# Patient Record
Sex: Female | Born: 1950 | Race: White | Hispanic: No | Smoking: Former smoker
Health system: Southern US, Community
[De-identification: ages and names within clinical notes are randomized; demographics above are authoritative.]

## PROBLEM LIST (undated history)

## (undated) DIAGNOSIS — I471 Supraventricular tachycardia, unspecified: Secondary | ICD-10-CM

## (undated) DIAGNOSIS — Z720 Tobacco use: Secondary | ICD-10-CM

## (undated) DIAGNOSIS — J449 Chronic obstructive pulmonary disease, unspecified: Secondary | ICD-10-CM

## (undated) HISTORY — DX: Supraventricular tachycardia: I47.1

## (undated) HISTORY — DX: Chronic obstructive pulmonary disease, unspecified: J44.9

## (undated) HISTORY — DX: Supraventricular tachycardia, unspecified: I47.10

## (undated) HISTORY — PX: TUBAL LIGATION: SHX77

## (undated) HISTORY — DX: Tobacco use: Z72.0

## (undated) HISTORY — PX: BREAST BIOPSY: SHX20

---

## 2009-05-17 ENCOUNTER — Ambulatory Visit: Payer: Self-pay | Admitting: Cardiology

## 2011-05-28 DIAGNOSIS — I471 Supraventricular tachycardia: Secondary | ICD-10-CM

## 2011-05-29 DIAGNOSIS — I471 Supraventricular tachycardia: Secondary | ICD-10-CM

## 2011-05-31 DIAGNOSIS — I4892 Unspecified atrial flutter: Secondary | ICD-10-CM

## 2011-06-01 DIAGNOSIS — J449 Chronic obstructive pulmonary disease, unspecified: Secondary | ICD-10-CM

## 2011-06-02 ENCOUNTER — Other Ambulatory Visit: Payer: Self-pay

## 2011-06-13 ENCOUNTER — Encounter: Payer: Self-pay | Admitting: *Deleted

## 2011-06-20 ENCOUNTER — Ambulatory Visit (INDEPENDENT_AMBULATORY_CARE_PROVIDER_SITE_OTHER): Payer: Medicare Other | Admitting: Cardiovascular Disease

## 2011-06-20 ENCOUNTER — Encounter: Payer: Self-pay | Admitting: Cardiovascular Disease

## 2011-06-20 VITALS — BP 112/78 | HR 103 | Ht 68.0 in | Wt 192.0 lb

## 2011-06-20 DIAGNOSIS — Z720 Tobacco use: Secondary | ICD-10-CM | POA: Insufficient documentation

## 2011-06-20 DIAGNOSIS — R000 Tachycardia, unspecified: Secondary | ICD-10-CM

## 2011-06-20 DIAGNOSIS — I498 Other specified cardiac arrhythmias: Secondary | ICD-10-CM

## 2011-06-20 DIAGNOSIS — I471 Supraventricular tachycardia: Secondary | ICD-10-CM

## 2011-06-20 DIAGNOSIS — F17200 Nicotine dependence, unspecified, uncomplicated: Secondary | ICD-10-CM

## 2011-06-20 MED ORDER — DILTIAZEM HCL ER COATED BEADS 240 MG PO CP24: 240.0000 mg | ORAL_CAPSULE | Freq: Every day | ORAL | Status: DC

## 2011-06-20 NOTE — Assessment & Plan Note (Signed)
I discussed with her the importance of smoking cessation. I especially warned her about smoking while using home oxygen.

## 2011-06-20 NOTE — Assessment & Plan Note (Signed)
The patient had recent regular narrow complex tachycardia in the setting of severe COPD exacerbation. She responded well to diltiazem. Her echocardiogram showed normal LV systolic function. 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 Another possibility is atrial flutter with 2 -1 block given that the rate was around 150 beats per minute. However, I could not see clear flutter waves. Regardless , I think the management is similar for now with diltiazem. Her chads score is low  And even if she has paroxysmal atrial flutter , I don't see the need for anticoagulation.  If she gets recurrent arrhythmia in the future, an ablation procedure can be considered.

## 2011-06-20 NOTE — Progress Notes (Signed)
HPI   this is a 61 year old female who is here today for a followup visit after recent hospitalization. She has known history of severe COPD on home oxygen and active tobacco use. She was hospitalized at [facility] recently for COPD exacerbation. She was noted to have episodes of regular narrow complex tachycardia with a heart rate of 150 beats per minute. It was suggestive of supraventricular tachycardia and less likely 2:1 atrial flutter.  She had an echocardiogram done which showed normal LV systolic function without significant valvular abnormalities. She was treated with diltiazem and was ultimately discharged on the extended release form 240 mg once daily. Overall, she feels better than before. Unfortunately, she continues to smoke one pack per day. She feels her heart is going fast but usually around 110 beats per minute. She has not had any syncope or presyncope. She denies chest pain.  No Known Allergies   Current Outpatient Prescriptions on File Prior to Visit  Medication Sig Dispense Refill  . aspirin EC 81 MG tablet Take 81 mg by mouth daily.      Marland Kitchen diltiazem (CARDIZEM CD) 240 MG 24 hr capsule Take 240 mg by mouth daily.      . diphenhydrAMINE (BENADRYL) 50 MG tablet Take 50 mg by mouth at bedtime.      . docusate sodium (COLACE) 100 MG capsule Take 100 mg by mouth 2 (two) times daily.      Marland Kitchen guaiFENesin (MUCINEX) 600 MG 12 hr tablet Take 600 mg by mouth 2 (two) times daily.      Marland Kitchen ipratropium-albuterol (DUONEB) 0.5-2.5 (3) MG/3ML SOLN Take 3 mLs by nebulization every 6 (six) hours as needed.      . OXYGEN-HELIUM IN Inhale 3 L into the lungs as directed.      . polyethylene glycol (MIRALAX / GLYCOLAX) packet Take 17 g by mouth daily as needed.       . tiotropium (SPIRIVA) 18 MCG inhalation capsule Place 18 mcg into inhaler and inhale daily.         Past Medical History  Diagnosis Date  . COPD (chronic obstructive pulmonary disease)     and deviated septum  . SVT  (supraventricular tachycardia)     Normal EF by echo  . Tobacco abuse      Past Surgical History  Procedure Date  . Tubal ligation   . Breast biopsy      Family History  Problem Relation Age of Onset  . Lung cancer       History   Social History  . Marital Status: Divorced    Spouse Name: N/A    Number of Children: N/A  . Years of Education: N/A   Occupational History  . Not on file.   Social History Main Topics  . Smoking status: Current Everyday Smoker -- 1.0 packs/day for 44 years    Types: Cigarettes  . Smokeless tobacco: Never Used   Comment: started smoking age 65  . Alcohol Use: Not on file  . Drug Use: Not on file  . Sexually Active: Not on file   Other Topics Concern  . Not on file   Social History Narrative  . No narrative on file     PHYSICAL EXAM   BP 112/78  Pulse 103  Ht 5\' 8"  (1.727 m)  Wt 192 lb (87.091 kg)  BMI 29.19 kg/m2  SpO2 97%  Constitutional: She is oriented to person, place, and time. She appears well-developed and well-nourished. No distress.  HENT: No nasal discharge.  Head: Normocephalic and atraumatic.  Eyes: Pupils are equal and round. Right eye exhibits no discharge. Left eye exhibits no discharge.  Neck: Normal range of motion. Neck supple. No JVD present. No thyromegaly present.  Cardiovascular: Normal rate, regular rhythm, normal heart sounds. Exam reveals no gallop and no friction rub. No murmur heard.  Pulmonary/Chest: Effort normal and  diminishedbreath sounds. No stridor. No respiratory distress. She has no wheezes. She has no rales. She exhibits no tenderness.  Abdominal: Soft. Bowel sounds are normal. She exhibits no distension. There is no tenderness. There is no rebound and no guarding.  Musculoskeletal: Normal range of motion. She exhibits no edema and no tenderness.  Neurological: She is alert and oriented to person, place, and time. Coordination normal.  Skin: Skin is warm and dry. No rash noted. She is  not diaphoretic. No erythema. No pallor.  Psychiatric: She has a normal mood and affect. Her behavior is normal. Judgment and thought content normal.    EKG:  Sinus tachycardia with no significant ST or T wave changes. Heart rate is between 101 - 105.   ASSESSMENT AND PLAN

## 2011-06-20 NOTE — Patient Instructions (Signed)
Your physician wants you to follow-up in: 6 months. You will receive a reminder letter in the mail one-two months in advance. If you don't receive a letter, please call our office to schedule the follow-up appointment. Your physician recommends that you continue on your current medications as directed. Please refer to the Current Medication list given to you today. (Your Diltiazem was refilled today.)

## 2011-12-25 ENCOUNTER — Ambulatory Visit: Payer: Medicare Other | Admitting: Cardiology

## 2012-01-02 ENCOUNTER — Ambulatory Visit: Payer: Medicare Other | Admitting: Physician Assistant

## 2012-01-05 ENCOUNTER — Ambulatory Visit: Payer: Medicare Other | Admitting: Physician Assistant

## 2012-01-08 ENCOUNTER — Ambulatory Visit (INDEPENDENT_AMBULATORY_CARE_PROVIDER_SITE_OTHER): Payer: Medicare Other | Admitting: Physician Assistant

## 2012-01-08 ENCOUNTER — Encounter: Payer: Self-pay | Admitting: Physician Assistant

## 2012-01-08 VITALS — BP 102/70 | HR 101 | Ht 69.0 in | Wt 202.0 lb

## 2012-01-08 DIAGNOSIS — R609 Edema, unspecified: Secondary | ICD-10-CM | POA: Insufficient documentation

## 2012-01-08 DIAGNOSIS — I498 Other specified cardiac arrhythmias: Secondary | ICD-10-CM

## 2012-01-08 DIAGNOSIS — J438 Other emphysema: Secondary | ICD-10-CM

## 2012-01-08 DIAGNOSIS — J439 Emphysema, unspecified: Secondary | ICD-10-CM | POA: Insufficient documentation

## 2012-01-08 DIAGNOSIS — I471 Supraventricular tachycardia: Secondary | ICD-10-CM

## 2012-01-08 MED ORDER — DILTIAZEM HCL ER COATED BEADS 240 MG PO CP24: 240.0000 mg | ORAL_CAPSULE | Freq: Every day | ORAL | Status: DC

## 2012-01-08 NOTE — Assessment & Plan Note (Signed)
Patient has history of normal RV function, by echocardiography in February of this year. She presents with no history or evidence of congestive heart failure. Her edema most likely is due to venous insufficiency, but may have been exacerbated by the addition of Cardizem. We'll continue to monitor clinically.

## 2012-01-08 NOTE — Assessment & Plan Note (Signed)
Stable on current dose Cardizem, and maintaining NSR as documented by today's EKG. 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 Dr. Kirke Corin previously noted her low CHADS score and recommended continued treatment with low-dose ASA, with no need for anticoagulation even if she were to have paroxysmal atrial flutter. In the event of recurrent arrhythmia, he did recommend consideration for RF ablation. We'll renew prescription for current dose Cardizem with sufficient refills for one full year. Will arrange return visit in one year, or sooner if needed.

## 2012-01-08 NOTE — Progress Notes (Signed)
Primary Cardiologist: Rollene Rotunda, MD (new)   HPI: Scheduled six-month followup.  Since last seen here in March 2013, by Dr. Kirke Corin, patient states that she has done extremely well from a cardiac standpoint. She has not had to return to the ED with symptomatic tachycardia palpitations, as she has done in the past. She is essentially here to refill her prescription for Cardizem, having taken her last dose yesterday.  Clinically, she continues to experience dyspnea, but only with exertion. This has not changed from her previous baseline. She denies any symptoms at rest. She is currently on supplemental oxygen, but states that she does not use this on a continuous basis. Of note, she also smokes an occasional cigarette, given that her son still smokes tobacco.  Regarding palpitations, these continue to occur only a few times a month. They are very brief in duration, lasting less than a minute. She does, however, develop associated dyspnea.   12-lead EKG today, reviewed by me, indicates sinus tachycardia 101 bpm; no acute changes.   No Known Allergies  Current Outpatient Prescriptions  Medication Sig Dispense Refill  . aspirin EC 81 MG tablet Take 81 mg by mouth daily.      . calcium carbonate (TUMS - DOSED IN MG ELEMENTAL CALCIUM) 500 MG chewable tablet Chew 1 tablet by mouth as needed.      . diltiazem (CARDIZEM CD) 240 MG 24 hr capsule Take 1 capsule (240 mg total) by mouth daily.  30 capsule  6  . docusate sodium (COLACE) 100 MG capsule Take 100 mg by mouth 2 (two) times daily.      Marland Kitchen guaiFENesin (MUCINEX) 600 MG 12 hr tablet Take 600 mg by mouth 2 (two) times daily.      Marland Kitchen ipratropium-albuterol (DUONEB) 0.5-2.5 (3) MG/3ML SOLN Take 3 mLs by nebulization 4 (four) times daily.       . OXYGEN-HELIUM IN Inhale 3 L into the lungs as directed.      . tiotropium (SPIRIVA) 18 MCG inhalation capsule Place 18 mcg into inhaler and inhale daily.        Past Medical History  Diagnosis Date  .  COPD (chronic obstructive pulmonary disease)     and deviated septum  . SVT (supraventricular tachycardia)     Normal EF by echo  . Tobacco abuse     Past Surgical History  Procedure Date  . Tubal ligation   . Breast biopsy     History   Social History  . Marital Status: Divorced    Spouse Name: N/A    Number of Children: N/A  . Years of Education: N/A   Occupational History  . Not on file.   Social History Main Topics  . Smoking status: Current Some Day Smoker -- 1.0 packs/day for 44 years    Types: Cigarettes  . Smokeless tobacco: Never Used   Comment: started smoking age 12; Socially smokes now.  . Alcohol Use: Not on file  . Drug Use: Not on file  . Sexually Active: Not on file   Other Topics Concern  . Not on file   Social History Narrative  . No narrative on file   Social History Narrative  . No narrative on file    Problem Relation Age of Onset  . Lung cancer      ROS: no nausea, vomiting; no fever, chills; no melena, hematochezia; no claudication  PHYSICAL EXAM: BP 102/70  Pulse 101  Ht 5\' 9"  (1.753 m)  Wt 202 lb (  91.627 kg)  BMI 29.83 kg/m2 GENERAL: 61 year old female; NAD HEENT: NCAT, PERRLA, EOMI; sclera clear; no xanthelasma; nasal cannula NECK: palpable bilateral carotid pulses, no bruits; no JVD; no TM LUNGS: Diminished breath sounds CARDIAC: RRR (S1, S2); no significant murmurs; no rubs or gallops ABDOMEN: soft, non-tender; intact BS EXTREMETIES: intact distal pulses; no significant peripheral edema SKIN: warm/dry; no obvious rash/lesions MUSCULOSKELETAL: no joint deformity NEURO: no focal deficit; NL affect   EKG: reviewed and available in Electronic Records   ASSESSMENT & PLAN:  SVT (supraventricular tachycardia) Stable on current dose Cardizem, and maintaining NSR as documented by today's EKG. 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 Dr. Kirke Corin previously noted her low CHADS score and recommended continued treatment with low-dose ASA, with no need for anticoagulation even if she were to have paroxysmal atrial flutter. In the event of recurrent arrhythmia, he did recommend consideration for RF ablation. We'll renew prescription for current dose Cardizem with sufficient refills for one full year. Will arrange return visit in one year, or sooner if needed.  Peripheral edema Patient has history of normal RV function, by echocardiography in February of this year. She presents with no history or evidence of congestive heart failure. Her edema most likely is due to venous insufficiency, but may have been exacerbated by the addition of Cardizem. We'll continue to monitor clinically.  Emphysema Patient is on supplemental oxygen, essentially on a continuous basis.    Gene Destry Dauber, PAC Upper

## 2012-01-08 NOTE — Assessment & Plan Note (Signed)
Patient is on supplemental oxygen, essentially on a continuous basis.

## 2012-01-08 NOTE — Patient Instructions (Signed)
Continue all current medications. Your physician wants you to follow up in:  1 year.  You will receive a reminder letter in the mail one-two months in advance.  If you don't receive a letter, please call our office to schedule the follow up appointment   

## 2012-12-27 ENCOUNTER — Ambulatory Visit (INDEPENDENT_AMBULATORY_CARE_PROVIDER_SITE_OTHER): Payer: Medicare Other | Admitting: Cardiology

## 2012-12-27 ENCOUNTER — Encounter: Payer: Self-pay | Admitting: Cardiology

## 2012-12-27 VITALS — BP 101/72 | HR 98 | Ht 69.0 in | Wt 200.8 lb

## 2012-12-27 DIAGNOSIS — I471 Supraventricular tachycardia: Secondary | ICD-10-CM

## 2012-12-27 DIAGNOSIS — I498 Other specified cardiac arrhythmias: Secondary | ICD-10-CM

## 2012-12-27 MED ORDER — DILTIAZEM HCL ER COATED BEADS 240 MG PO CP24: 240.0000 mg | ORAL_CAPSULE | Freq: Every day | ORAL | Status: DC

## 2012-12-27 NOTE — Progress Notes (Addendum)
   Clinical Summary Ms. Madding is a 62 y.o.female   1. Palpitations - hx of SVT per prior clinic notes, thought most likely to be AVNRT vs aflutter. Managed medically, not on anticoag previously in setting of possible flutter, patient w/ low CHADS2 score.  - feels palps about once per month. Lasts for just a few months.  - compliant w/ diltiazem - does not drink caffeine, no EtoH.    Past Medical History  Diagnosis Date  . COPD (chronic obstructive pulmonary disease)     and deviated septum  . SVT (supraventricular tachycardia)     Normal EF by echo  . Tobacco abuse      No Known Allergies   Current Outpatient Prescriptions  Medication Sig Dispense Refill  . aspirin EC 81 MG tablet Take 81 mg by mouth daily.      . calcium carbonate (TUMS - DOSED IN MG ELEMENTAL CALCIUM) 500 MG chewable tablet Chew 1 tablet by mouth as needed.      . diltiazem (CARDIZEM CD) 240 MG 24 hr capsule Take 1 capsule (240 mg total) by mouth daily.  30 capsule  11  . docusate sodium (COLACE) 100 MG capsule Take 100 mg by mouth 2 (two) times daily.      Marland Kitchen guaiFENesin (MUCINEX) 600 MG 12 hr tablet Take 600 mg by mouth 2 (two) times daily.      Marland Kitchen ipratropium-albuterol (DUONEB) 0.5-2.5 (3) MG/3ML SOLN Take 3 mLs by nebulization 4 (four) times daily.       . OXYGEN-HELIUM IN Inhale 3 L into the lungs as directed.      . tiotropium (SPIRIVA) 18 MCG inhalation capsule Place 18 mcg into inhaler and inhale daily.       No current facility-administered medications for this visit.     Past Surgical History  Procedure Laterality Date  . Tubal ligation    . Breast biopsy       No Known Allergies    Family History  Problem Relation Age of Onset  . Lung cancer       Social History Ms. Grunden reports that she has been smoking Cigarettes.  She has a 44 pack-year smoking history. She has never used smokeless tobacco. Ms. Silos has no alcohol history on file.   Review of Systems Complete ROS  negative other than reported in HPI  Physical Examination p 98 bp 101/72 Wt 200 lbs BMI 29 Gen: resting comfortably, NAD HEENT: no scleral icterus, pupils equal round and reactive, no palptable cervical adenopathy CV: RRR, no m/r/g, no JVD, no carotid bruits Pulm: CTAB Abd: soft, NT, ND NABS, no hepatosplenomegaly MSK warm, no edema.  Skin: warm, no rash Neuro: A&Ox3, no focal deficits    Diagnostic Studies 05/2011 Echo: mild LVH, LVEF 55-60%, normal diastolic function, normal RV function,   12/27/12 Clinic EKG: sinus rhythm, normal axis, LAE, no ischemic changes   Assessment and Plan  1. Palpitations - symptoms well controlled on current therapy - continue current meds   Antoine Poche, M.D., F.A.C.C.

## 2012-12-27 NOTE — Patient Instructions (Signed)
Your physician recommends that you schedule a follow-up appointment in: 6 months with Dr. Branch. You should receive a letter in the mail in 4 months. If you do not receive this letter by January 2015 call our office to schedule this appointment.   Your physician recommends that you continue on your current medications as directed. Please refer to the Current Medication list given to you today.   

## 2013-08-07 ENCOUNTER — Encounter: Payer: Self-pay | Admitting: Cardiology

## 2013-08-07 ENCOUNTER — Ambulatory Visit (INDEPENDENT_AMBULATORY_CARE_PROVIDER_SITE_OTHER): Payer: Medicare PPO | Admitting: Cardiology

## 2013-08-07 VITALS — BP 117/68 | HR 98 | Ht 69.0 in | Wt 199.0 lb

## 2013-08-07 DIAGNOSIS — I498 Other specified cardiac arrhythmias: Secondary | ICD-10-CM

## 2013-08-07 DIAGNOSIS — I471 Supraventricular tachycardia: Secondary | ICD-10-CM

## 2013-08-07 MED ORDER — DILTIAZEM HCL ER COATED BEADS 300 MG PO CP24: 300.0000 mg | ORAL_CAPSULE | Freq: Every day | ORAL | Status: DC

## 2013-08-07 NOTE — Progress Notes (Signed)
Clinical Summary Margaret Stokes is a 63 y.o.female seen today for follow up of the following medical problems.   1. 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 Managed medically, not on anticoag previously in setting of possible flutter, patient w/ low CHADS2 score.   - feels palps about once day. Lasts for just a few minutes.  - compliant w/ diltiazem  - does not drink caffeine, no EtoH.   2. COPD - [facility] - on home oxygen   Past Medical History  Diagnosis Date  . COPD (chronic obstructive pulmonary disease)     and deviated septum  . SVT (supraventricular tachycardia)     Normal EF by echo  . Tobacco abuse      No Known Allergies   Current Outpatient Prescriptions  Medication Sig Dispense Refill  . aspirin EC 81 MG tablet Take 81 mg by mouth daily.      . calcium carbonate (TUMS - DOSED IN MG ELEMENTAL CALCIUM) 500 MG chewable tablet Chew 1 tablet by mouth as needed.      . diltiazem (CARDIZEM CD) 240 MG 24 hr capsule Take 1 capsule (240 mg total) by mouth daily.  30 capsule  11  . ipratropium-albuterol (DUONEB) 0.5-2.5 (3) MG/3ML SOLN Take 3 mLs by nebulization 4 (four) times daily.       . OXYGEN-HELIUM IN Inhale 3 L into the lungs as directed.       No current facility-administered medications for this visit.     Past Surgical History  Procedure Laterality Date  . Tubal ligation    . Breast biopsy       No Known Allergies    Family History  Problem Relation Age of Onset  . Lung cancer       Social History Margaret Stokes reports that she has been smoking Cigarettes.  She has a 44 pack-year smoking history. She has never used smokeless tobacco. Margaret Stokes has no alcohol history on file.   Review of Systems CONSTITUTIONAL: No weight loss, fever, chills, weakness or fatigue.  HEENT: Eyes: No visual loss, blurred vision, double vision or yellow sclerae.No hearing loss, sneezing,  congestion, runny nose or sore throat.  SKIN: No rash or itching.  CARDIOVASCULAR: per HPI RESPIRATORY: No shortness of breath, cough or sputum.  GASTROINTESTINAL: No anorexia, nausea, vomiting or diarrhea. No abdominal pain or blood.  GENITOURINARY: No burning on urination, no polyuria NEUROLOGICAL: No headache, dizziness, syncope, paralysis, ataxia, numbness or tingling in the extremities. No change in bowel or bladder control.  MUSCULOSKELETAL: No muscle, back pain, joint pain or stiffness.  LYMPHATICS: No enlarged nodes. No history of splenectomy.  PSYCHIATRIC: No history of depression or anxiety.  ENDOCRINOLOGIC: No reports of sweating, cold or heat intolerance. No polyuria or polydipsia.  Marland Kitchen.   Physical Examination p 98 bp 117/68 Wt 199 lbs BMI 29 Gen: resting comfortably, no acute distress HEENT: no scleral icterus, pupils equal round and reactive, no palptable cervical adenopathy,  CV: RRR, no m/r/g, no JVD, no carotid bruits Resp: Clear to auscultation bilaterally GI: abdomen is soft, non-tender, non-distended, normal bowel sounds, no hepatosplenomegaly MSK: extremities are warm, no edema.  Skin: warm, no rash Neuro:  no focal deficits Psych: appropriate affect   Diagnostic Studies 05/2011 Echo: mild LVH, LVEF 55-60%, normal diastolic function, normal RV function,   12/27/12 Clinic EKG: sinus rhythm, normal axis, LAE, no ischemic changes  Assessment and Plan  1. Palpitations  - will increase dilt and follow symptoms  2. COPD - continue home O2 and inhalers   F/u 1 month       Antoine PocheJonathan F. Khair Chasteen, M.D., F.A.C.C.

## 2013-08-07 NOTE — Patient Instructions (Signed)
   Increase Diltiazem CD to 300mg  daily - new sent to pharm Continue all other medications.   Follow up in  1 month

## 2013-09-08 ENCOUNTER — Encounter: Payer: Self-pay | Admitting: Cardiology

## 2013-09-08 ENCOUNTER — Ambulatory Visit (INDEPENDENT_AMBULATORY_CARE_PROVIDER_SITE_OTHER): Payer: Medicare PPO | Admitting: Cardiology

## 2013-09-08 VITALS — BP 130/85 | HR 106 | Ht 69.0 in | Wt 198.0 lb

## 2013-09-08 DIAGNOSIS — I498 Other specified cardiac arrhythmias: Secondary | ICD-10-CM

## 2013-09-08 DIAGNOSIS — R002 Palpitations: Secondary | ICD-10-CM

## 2013-09-08 DIAGNOSIS — I471 Supraventricular tachycardia: Secondary | ICD-10-CM

## 2013-09-08 MED ORDER — DILTIAZEM HCL ER COATED BEADS 300 MG PO CP24: 300.0000 mg | ORAL_CAPSULE | Freq: Every day | ORAL | Status: DC

## 2013-09-08 NOTE — Patient Instructions (Signed)
Continue all current medications. Your physician wants you to follow up in:  1 year.  You will receive a reminder letter in the mail one-two months in advance.  If you don't receive a letter, please call our office to schedule the follow up appointment   

## 2013-09-08 NOTE — Progress Notes (Signed)
Clinical Summary [name] is a 63 y.o.female seen for follow up of the following medical problems.  1. 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 Managed medically, she had not been on anticoag previously in setting of possible flutter due to low CHADS2 score according to her older notes - feels palps about once day. Lasts for just a few minutes.  - does not drink caffeine, no EtoH.  - last visit increased cardizem to 300mg  qday, symptoms have improved   2. COPD  - [facility]  - on home oxygen    Past Medical History  Diagnosis Date  . COPD (chronic obstructive pulmonary disease)     and deviated septum  . SVT (supraventricular tachycardia)     Normal EF by echo  . Tobacco abuse      No Known Allergies   Current Outpatient Prescriptions  Medication Sig Dispense Refill  . albuterol (PROAIR HFA) 108 (90 BASE) MCG/ACT inhaler Inhale 2 puffs into the lungs every 6 (six) hours as needed for wheezing or shortness of breath.      Marland Kitchen. aspirin EC 81 MG tablet Take 81 mg by mouth daily.      . calcium carbonate (TUMS - DOSED IN MG ELEMENTAL CALCIUM) 500 MG chewable tablet Chew 1 tablet by mouth as needed.      . diltiazem (CARDIZEM CD) 300 MG 24 hr capsule Take 1 capsule (300 mg total) by mouth daily.  30 capsule  6  . ipratropium-albuterol (DUONEB) 0.5-2.5 (3) MG/3ML SOLN Take 3 mLs by nebulization 4 (four) times daily.       . OXYGEN-HELIUM IN Inhale 3 L into the lungs as directed.       No current facility-administered medications for this visit.     Past Surgical History  Procedure Laterality Date  . Tubal ligation    . Breast biopsy       No Known Allergies    Family History  Problem Relation Age of Onset  . Lung cancer       Social History [name] reports that she has been smoking Cigarettes.  She has a 44 pack-year smoking history. She has never used smokeless tobacco. Ms.  Margaret Stokes has no alcohol history on file.   Review of Systems CONSTITUTIONAL: No weight loss, fever, chills, weakness or fatigue.  HEENT: Eyes: No visual loss, blurred vision, double vision or yellow sclerae.No hearing loss, sneezing, congestion, runny nose or sore throat.  SKIN: No rash or itching.  CARDIOVASCULAR: per HPI RESPIRATORY: +SOB  GASTROINTESTINAL: No anorexia, nausea, vomiting or diarrhea. No abdominal pain or blood.  GENITOURINARY: No burning on urination, no polyuria NEUROLOGICAL: No headache, dizziness, syncope, paralysis, ataxia, numbness or tingling in the extremities. No change in bowel or bladder control.  MUSCULOSKELETAL: No muscle, back pain, joint pain or stiffness.  LYMPHATICS: No enlarged nodes. No history of splenectomy.  PSYCHIATRIC: No history of depression or anxiety.  ENDOCRINOLOGIC: No reports of sweating, cold or heat intolerance. No polyuria or polydipsia.  Marland Kitchen.   Physical Examination p 106 bp 130/85 Wt 198 lbs BMI 29 Gen: resting comfortably, no acute distress HEENT: no scleral icterus, pupils equal round and reactive, no palptable cervical adenopathy,  CV: RRR, no m/r/g, no JVD Resp: Clear to auscultation bilaterally GI: abdomen is soft, non-tender, non-distended, normal bowel sounds, no hepatosplenomegaly MSK: extremities are warm, no edema.  Skin: warm, no rash Neuro:  no focal deficits Psych: appropriate affect   Diagnostic Studies 05/2011 Echo: mild LVH, LVEF 55-60%, normal diastolic function, normal RV function,   12/27/12 Clinic EKG: sinus rhythm, normal axis, LAE, no ischemic changes     Assessment and Plan  1. Palpitations  - symptoms improved since increasing dilt last visit - continue current medical therapy   2. COPD  - continue home O2 and inhalers     F/u 1 year  Antoine Poche, M.D., F.A.C.C.

## 2014-02-19 ENCOUNTER — Encounter: Payer: Self-pay | Admitting: Cardiology

## 2014-02-19 ENCOUNTER — Ambulatory Visit (INDEPENDENT_AMBULATORY_CARE_PROVIDER_SITE_OTHER): Payer: Medicare PPO | Admitting: Cardiology

## 2014-02-19 VITALS — BP 113/74 | HR 94 | Ht 69.0 in | Wt 194.0 lb

## 2014-02-19 DIAGNOSIS — R0602 Shortness of breath: Secondary | ICD-10-CM

## 2014-02-19 DIAGNOSIS — I471 Supraventricular tachycardia: Secondary | ICD-10-CM

## 2014-02-19 DIAGNOSIS — R002 Palpitations: Secondary | ICD-10-CM

## 2014-02-19 NOTE — Patient Instructions (Signed)
Your physician has requested that you have an echocardiogram. Echocardiography is a painless test that uses sound waves to create images of your heart. It provides your doctor with information about the size and shape of your heart and how well your heart's chambers and valves are working. This procedure takes approximately one hour. There are no restrictions for this procedure. Your physician has recommended that you wear a 48 hour holter monitor. Holter monitors are medical devices that record the heart's electrical activity. Doctors most often use these monitors to diagnose arrhythmias. Arrhythmias are problems with the speed or rhythm of the heartbeat. The monitor is a small, portable device. You can wear one while you do your normal daily activities. This is usually used to diagnose what is causing palpitations/syncope (passing out). Office will contact with results via phone or letter.   Continue all current medications. Follow up in  1 month

## 2014-02-19 NOTE — Progress Notes (Signed)
Clinical Summary Margaret Stokes is a 63 y.o.female seen today for follow up of the following medical problems.   1. 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 Managed medically, she had not been on anticoag previously in setting of possible flutter due to low CHADS2 score according to her older notes - she denies any recent palpitations. She was seen by her pulmonologist recently, during ambulatory O2 assessment heart rate noted to increase to 160. Patient reports her normal DOE at that time, no significant palpitaitons. She we increased her dilitazem she has not had any significant palpitations.   2. COPD  - [facility]  - on home oxygen  -  Past Medical History  Diagnosis Date  . COPD (chronic obstructive pulmonary disease)     and deviated septum  . SVT (supraventricular tachycardia)     Normal EF by echo  . Tobacco abuse      No Known Allergies   Current Outpatient Prescriptions  Medication Sig Dispense Refill  . albuterol (PROAIR HFA) 108 (90 BASE) MCG/ACT inhaler Inhale 2 puffs into the lungs every 6 (six) hours as needed for wheezing or shortness of breath.    Marland Kitchen. aspirin EC 81 MG tablet Take 81 mg by mouth daily.    . calcium carbonate (TUMS - DOSED IN MG ELEMENTAL CALCIUM) 500 MG chewable tablet Chew 1 tablet by mouth as needed.    . diltiazem (CARDIZEM CD) 300 MG 24 hr capsule Take 1 capsule (300 mg total) by mouth daily. 30 capsule 11  . ipratropium-albuterol (DUONEB) 0.5-2.5 (3) MG/3ML SOLN Take 3 mLs by nebulization 4 (four) times daily.     . OXYGEN-HELIUM IN Inhale 3 L into the lungs as directed.     No current facility-administered medications for this visit.     Past Surgical History  Procedure Laterality Date  . Tubal ligation    . Breast biopsy       No Known Allergies    Family History  Problem Relation Age of Onset  . Lung cancer       Social History Margaret Stokes  reports that she has quit smoking. Her smoking use included Cigarettes. She has a 44 pack-year smoking history. She has never used smokeless tobacco. Margaret Stokes has no alcohol history on file.   Review of Systems CONSTITUTIONAL: No weight loss, fever, chills, weakness or fatigue.  HEENT: Eyes: No visual loss, blurred vision, double vision or yellow sclerae.No hearing loss, sneezing, congestion, runny nose or sore throat.  SKIN: No rash or itching.  CARDIOVASCULAR: per HPI RESPIRATORY: No cough or sputum.  GASTROINTESTINAL: No anorexia, nausea, vomiting or diarrhea. No abdominal pain or blood.  GENITOURINARY: No burning on urination, no polyuria NEUROLOGICAL: No headache, dizziness, syncope, paralysis, ataxia, numbness or tingling in the extremities. No change in bowel or bladder control.  MUSCULOSKELETAL: No muscle, back pain, joint pain or stiffness.  LYMPHATICS: No enlarged nodes. No history of splenectomy.  PSYCHIATRIC: No history of depression or anxiety.  ENDOCRINOLOGIC: No reports of sweating, cold or heat intolerance. No polyuria or polydipsia.  Marland Kitchen.   Physical Examination p 94 bp 113/74 Wt 194 lbs BMI 28 Gen: resting comfortably, no acute distress HEENT: no scleral icterus, pupils equal round and reactive, no palptable cervical adenopathy,  CV: RRR, no m/r/g, no JVD, no carotid bruits Resp: Clear to auscultation bilaterally GI: abdomen is soft, non-tender, non-distended, normal bowel sounds, no  hepatosplenomegaly MSK: extremities are warm, no edema.  Skin: warm, no rash Neuro:  no focal deficits Psych: appropriate affect   Diagnostic Studies 05/2011 Echo: mild LVH, LVEF 55-60%, normal diastolic function, normal RV function,   12/27/12 Clinic EKG: sinus rhythm, normal axis, LAE, no ischemic changes    Assessment and Plan  1. Palpitations - symptoms have been well controlled since increase her diltiazem, noted tachycardia on recent ambulatory oxygen testing with rates  reported in 160s. Will obtain 48 hr holter to better evaluate potential ongoing arrhythmias   F/u 1 months      Antoine PocheJonathan F. Quintus Premo, M.D.

## 2014-02-24 ENCOUNTER — Other Ambulatory Visit: Payer: Self-pay

## 2014-02-24 ENCOUNTER — Other Ambulatory Visit (INDEPENDENT_AMBULATORY_CARE_PROVIDER_SITE_OTHER): Payer: Medicare PPO

## 2014-02-24 DIAGNOSIS — R0602 Shortness of breath: Secondary | ICD-10-CM

## 2014-02-24 DIAGNOSIS — I471 Supraventricular tachycardia: Secondary | ICD-10-CM

## 2014-02-24 DIAGNOSIS — R002 Palpitations: Secondary | ICD-10-CM

## 2014-02-25 ENCOUNTER — Other Ambulatory Visit: Payer: Self-pay | Admitting: *Deleted

## 2014-02-25 DIAGNOSIS — R002 Palpitations: Secondary | ICD-10-CM

## 2014-03-18 ENCOUNTER — Ambulatory Visit (INDEPENDENT_AMBULATORY_CARE_PROVIDER_SITE_OTHER): Payer: Medicare PPO | Admitting: Cardiology

## 2014-03-18 ENCOUNTER — Encounter: Payer: Self-pay | Admitting: Cardiology

## 2014-03-18 VITALS — BP 117/76 | HR 97 | Ht 69.0 in | Wt 196.0 lb

## 2014-03-18 DIAGNOSIS — R002 Palpitations: Secondary | ICD-10-CM

## 2014-03-18 MED ORDER — DILTIAZEM HCL ER COATED BEADS 300 MG PO CP24: 300.0000 mg | ORAL_CAPSULE | Freq: Every day | ORAL | Status: DC

## 2014-03-18 NOTE — Progress Notes (Signed)
Clinical Summary Margaret Stokes is a 63 y.o.female seen today for follow up of the following medical problems.   1. 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 Managed medically, she had not been on anticoag previously in setting of possible flutter due to low CHADS2 score according to her older notes - She was seen by her pulmonologist recently, during ambulatory O2 assessment heart rate noted to increase to 160. Patient reports her normal DOE at that time, no significant palpitaitons. Her diltiazem was increased to 300mg  daily around that time - since last visit completed 48 hr heart monitor, with no significant arrhythmias    2. COPD  - [facility]  - on home oxygen Past Medical History  Diagnosis Date  . COPD (chronic obstructive pulmonary disease)     and deviated septum  . SVT (supraventricular tachycardia)     Normal EF by echo  . Tobacco abuse      No Known Allergies   Current Outpatient Prescriptions  Medication Sig Dispense Refill  . albuterol (PROAIR HFA) 108 (90 BASE) MCG/ACT inhaler Inhale 2 puffs into the lungs every 6 (six) hours as needed for wheezing or shortness of breath.    Marland Kitchen. aspirin EC 81 MG tablet Take 81 mg by mouth daily.    . calcium carbonate (TUMS - DOSED IN MG ELEMENTAL CALCIUM) 500 MG chewable tablet Chew 1 tablet by mouth as needed.    . diltiazem (CARDIZEM CD) 300 MG 24 hr capsule Take 1 capsule (300 mg total) by mouth daily. 30 capsule 11  . Fluticasone-Salmeterol (ADVAIR) 250-50 MCG/DOSE AEPB Inhale 1 puff into the lungs 2 (two) times daily.    Marland Kitchen. ipratropium-albuterol (DUONEB) 0.5-2.5 (3) MG/3ML SOLN Take 3 mLs by nebulization 4 (four) times daily.     . OXYGEN-HELIUM IN Inhale 3 L into the lungs as directed.     No current facility-administered medications for this visit.     Past Surgical History  Procedure Laterality Date  . Tubal ligation     . Breast biopsy       No Known Allergies    Family History  Problem Relation Age of Onset  . Lung cancer       Social History Margaret Stokes reports that she quit smoking about 2 years ago. Her smoking use included Cigarettes. She started smoking about 47 years ago. She has a 44 pack-year smoking history. She has never used smokeless tobacco. Margaret Stokes reports that she does not drink alcohol.   Review of Systems CONSTITUTIONAL: No weight loss, fever, chills, weakness or fatigue.  HEENT: Eyes: No visual loss, blurred vision, double vision or yellow sclerae.No hearing loss, sneezing, congestion, runny nose or sore throat.  SKIN: No rash or itching.  CARDIOVASCULAR: per HPI RESPIRATORY: No shortness of breath, cough or sputum.  GASTROINTESTINAL: No anorexia, nausea, vomiting or diarrhea. No abdominal pain or blood.  GENITOURINARY: No burning on urination, no polyuria NEUROLOGICAL: No headache, dizziness, syncope, paralysis, ataxia, numbness or tingling in the extremities. No change in bowel or bladder control.  MUSCULOSKELETAL: No muscle, back pain, joint pain or stiffness.  LYMPHATICS: No enlarged nodes. No history of splenectomy.  PSYCHIATRIC: No history of depression or anxiety.  ENDOCRINOLOGIC: No reports of sweating, cold or heat intolerance. No polyuria or polydipsia.  Marland Kitchen.   Physical Examination p 97 bp 117/76 Wt 196 lbs BMI 29 Gen: resting comfortably,  no acute distress HEENT: no scleral icterus, pupils equal round and reactive, no palptable cervical adenopathy,  CV: RRR, no m/r/g, no JVD, no carotid bruits Resp: Clear to auscultation bilaterally GI: abdomen is soft, non-tender, non-distended, normal bowel sounds, no hepatosplenomegaly MSK: extremities are warm, no edema.  Skin: warm, no rash Neuro:  no focal deficits Psych: appropriate affect   Diagnostic Studies 05/2011 Echo: mild LVH, LVEF 55-60%, normal diastolic function, normal RV function,   12/27/12  Clinic EKG: sinus rhythm, normal axis, LAE, no ischemic changes    Assessment and Plan   1. Palpitations - episode of tachycardia at her pulmonologists office, dilt increased - no palpitations since that time, 48 hr monitor without significant arrhythmias - continue current therapy   F/u 1 year      Antoine PocheJonathan F. Monika Chestang, M.D.

## 2014-03-18 NOTE — Patient Instructions (Signed)
Continue all current medications. Refill sent to pharmacy on your Diltiazem. Your physician wants you to follow up in:  1 year.  You will receive a reminder letter in the mail one-two months in advance.  If you don't receive a letter, please call our office to schedule the follow up appointment

## 2015-03-23 ENCOUNTER — Ambulatory Visit (INDEPENDENT_AMBULATORY_CARE_PROVIDER_SITE_OTHER): Payer: Medicare HMO | Admitting: Cardiology

## 2015-03-23 ENCOUNTER — Encounter: Payer: Self-pay | Admitting: Cardiology

## 2015-03-23 VITALS — BP 105/71 | HR 98 | Ht 69.0 in | Wt 195.2 lb

## 2015-03-23 DIAGNOSIS — R600 Localized edema: Secondary | ICD-10-CM

## 2015-03-23 DIAGNOSIS — I471 Supraventricular tachycardia: Secondary | ICD-10-CM

## 2015-03-23 MED ORDER — DILTIAZEM HCL ER COATED BEADS 300 MG PO CP24: 300.0000 mg | ORAL_CAPSULE | Freq: Every day | ORAL | Status: AC

## 2015-03-23 MED ORDER — FUROSEMIDE 20 MG PO TABS: ORAL_TABLET | ORAL | Status: DC

## 2015-03-23 NOTE — Patient Instructions (Signed)
Your physician recommends that you schedule a follow-up appointment in: 3 MONTHS WITH DR BRANCH  Your physician has recommended you make the following change in your medication:   START LASIX 20 MG DAILY AS NEEDED FOR SWELLING  Thank you for choosing [facility]!!     

## 2015-03-23 NOTE — Progress Notes (Signed)
Patient ID: Margaret Stokes, female   DOB: 07-19-1950, 64 y.o.   MRN: [id number]     Clinical Summary Ms. Pant is a 64 y.o.female 64 y.o.female seen today for follow up of the following medical problems.   1. 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 Managed medically, she had not been on anticoag previously in setting of possible flutter due to low CHADS2 score according to her older notes - since last visit completed 48 hr heart monitor, with no significant arrhythmias  - reports oly occasional palpitations since our last visit, overall not bothersome  2. COPD  - followed at [facility] - she is off home O2 due to improved symptoms.     Past Medical History  Diagnosis Date  . COPD (chronic obstructive pulmonary disease)     and deviated septum  . SVT (supraventricular tachycardia)     Normal EF by echo  . Tobacco abuse      No Known Allergies   Current Outpatient Prescriptions  Medication Sig Dispense Refill  . albuterol (PROAIR HFA) 108 (90 BASE) MCG/ACT inhaler Inhale 2 puffs into the lungs every 6 (six) hours as needed for wheezing or shortness of breath.    Marland Kitchen aspirin EC 81 MG tablet Take 81 mg by mouth daily.    . calcium carbonate (TUMS - DOSED IN MG ELEMENTAL CALCIUM) 500 MG chewable tablet Chew 1 tablet by mouth as needed.    . diltiazem (CARDIZEM CD) 300 MG 24 hr capsule Take 1 capsule (300 mg total) by mouth daily. 30 capsule 11  . Fluticasone-Salmeterol (ADVAIR) 250-50 MCG/DOSE AEPB Inhale 1 puff into the lungs 2 (two) times daily.    Marland Kitchen ipratropium-albuterol (DUONEB) 0.5-2.5 (3) MG/3ML SOLN Take 3 mLs by nebulization 4 (four) times daily.     . OXYGEN-HELIUM IN Inhale 3 L into the lungs as directed.     No current facility-administered medications for this visit.     Past Surgical History  Procedure Laterality Date  . Tubal ligation    . Breast biopsy       No Known  Allergies    Family History  Problem Relation Age of Onset  . Lung cancer       Social History Ms. Hauk reports that she quit smoking about 3 years ago. Her smoking use included Cigarettes. She started smoking about 48 years ago. She has a 44 pack-year smoking history. She has never used smokeless tobacco. Ms. Geyer reports that she does not drink alcohol.   Review of Systems CONSTITUTIONAL: No weight loss, fever, chills, weakness or fatigue.  HEENT: Eyes: No visual loss, blurred vision, double vision or yellow sclerae.No hearing loss, sneezing, congestion, runny nose or sore throat.  SKIN: No rash or itching.  CARDIOVASCULAR: per hpi RESPIRATORY: +SOB GASTROINTESTINAL: No anorexia, nausea, vomiting or diarrhea. No abdominal pain or blood.  GENITOURINARY: No burning on urination, no polyuria NEUROLOGICAL: No headache, dizziness, syncope, paralysis, ataxia, numbness or tingling in the extremities. No change in bowel or bladder control.  MUSCULOSKELETAL: No muscle, back pain, joint pain or stiffness.  LYMPHATICS: No enlarged nodes. No history of splenectomy.  PSYCHIATRIC: No history of depression or anxiety.  ENDOCRINOLOGIC: No reports of sweating, cold or heat intolerance. No polyuria or polydipsia.  Marland Kitchen   Physical Examination Filed Vitals:   03/23/15 1350  BP: 105/71  Pulse: 98   Filed Vitals:   03/23/15 1350  Height: 5'  9" (1.753 m)  Weight: 195 lb 3.2 oz (88.542 kg)    Gen: resting comfortably, no acute distress HEENT: no scleral icterus, pupils equal round and reactive, no palptable cervical adenopathy,  CV: RRR, no m/r/g, no jvd Resp: Clear to auscultation bilaterally GI: abdomen is soft, non-tender, non-distended, normal bowel sounds, no hepatosplenomegaly MSK: extremities are warm, no edema.  Skin: warm, no rash Neuro:  no focal deficits Psych: appropriate affect   Diagnostic Studies 05/2011 Echo: mild LVH, LVEF 55-60%, normal diastolic function, normal  RV function,   12/27/12 Clinic EKG: sinus rhythm, normal axis, LAE, no ischemic changes  03/2014 Holter: no significant arrhythmias.   Assessment and Plan  1. Palpitations - overall controlled on dilt, will continue current dosing  2. LE edema - occasional swelling at times, likely due to mild diastolic dysfunction. Will start prn lasix.    F/u 3 months      Antoine PocheJonathan F. Branch, M.D

## 2015-04-05 ENCOUNTER — Encounter (HOSPITAL_COMMUNITY): Payer: Self-pay

## 2015-04-05 ENCOUNTER — Emergency Department (HOSPITAL_COMMUNITY): Payer: Medicare HMO

## 2015-04-05 ENCOUNTER — Inpatient Hospital Stay (HOSPITAL_COMMUNITY)
Admission: EM | Admit: 2015-04-05 | Discharge: 2015-04-16 | DRG: 190 | Disposition: A | Discharge status: Skilled Nursing Facility | Payer: Medicare HMO | Attending: Internal Medicine | Admitting: Internal Medicine

## 2015-04-05 DIAGNOSIS — J9600 Acute respiratory failure, unspecified whether with hypoxia or hypercapnia: Secondary | ICD-10-CM | POA: Insufficient documentation

## 2015-04-05 DIAGNOSIS — J440 Chronic obstructive pulmonary disease with acute lower respiratory infection: Secondary | ICD-10-CM | POA: Diagnosis present

## 2015-04-05 DIAGNOSIS — T380X5A Adverse effect of glucocorticoids and synthetic analogues, initial encounter: Secondary | ICD-10-CM | POA: Diagnosis not present

## 2015-04-05 DIAGNOSIS — K5900 Constipation, unspecified: Secondary | ICD-10-CM | POA: Diagnosis present

## 2015-04-05 DIAGNOSIS — J441 Chronic obstructive pulmonary disease with (acute) exacerbation: Secondary | ICD-10-CM | POA: Diagnosis not present

## 2015-04-05 DIAGNOSIS — R0600 Dyspnea, unspecified: Secondary | ICD-10-CM

## 2015-04-05 DIAGNOSIS — J9622 Acute and chronic respiratory failure with hypercapnia: Secondary | ICD-10-CM | POA: Diagnosis not present

## 2015-04-05 DIAGNOSIS — Z9119 Patient's noncompliance with other medical treatment and regimen: Secondary | ICD-10-CM | POA: Diagnosis not present

## 2015-04-05 DIAGNOSIS — B370 Candidal stomatitis: Secondary | ICD-10-CM | POA: Diagnosis not present

## 2015-04-05 DIAGNOSIS — J9620 Acute and chronic respiratory failure, unspecified whether with hypoxia or hypercapnia: Secondary | ICD-10-CM | POA: Diagnosis present

## 2015-04-05 DIAGNOSIS — J189 Pneumonia, unspecified organism: Secondary | ICD-10-CM

## 2015-04-05 DIAGNOSIS — Z7982 Long term (current) use of aspirin: Secondary | ICD-10-CM | POA: Diagnosis not present

## 2015-04-05 DIAGNOSIS — Z23 Encounter for immunization: Secondary | ICD-10-CM | POA: Diagnosis not present

## 2015-04-05 DIAGNOSIS — N179 Acute kidney failure, unspecified: Secondary | ICD-10-CM | POA: Diagnosis not present

## 2015-04-05 DIAGNOSIS — I5033 Acute on chronic diastolic (congestive) heart failure: Secondary | ICD-10-CM | POA: Diagnosis present

## 2015-04-05 DIAGNOSIS — Z87891 Personal history of nicotine dependence: Secondary | ICD-10-CM

## 2015-04-05 DIAGNOSIS — J9690 Respiratory failure, unspecified, unspecified whether with hypoxia or hypercapnia: Secondary | ICD-10-CM

## 2015-04-05 DIAGNOSIS — R0902 Hypoxemia: Secondary | ICD-10-CM

## 2015-04-05 DIAGNOSIS — T501X5A Adverse effect of loop [high-ceiling] diuretics, initial encounter: Secondary | ICD-10-CM | POA: Diagnosis not present

## 2015-04-05 DIAGNOSIS — J9601 Acute respiratory failure with hypoxia: Secondary | ICD-10-CM | POA: Diagnosis not present

## 2015-04-05 DIAGNOSIS — E785 Hyperlipidemia, unspecified: Secondary | ICD-10-CM | POA: Diagnosis present

## 2015-04-05 DIAGNOSIS — R0602 Shortness of breath: Secondary | ICD-10-CM | POA: Diagnosis present

## 2015-04-05 DIAGNOSIS — J9621 Acute and chronic respiratory failure with hypoxia: Secondary | ICD-10-CM | POA: Diagnosis present

## 2015-04-05 LAB — URINALYSIS, ROUTINE W REFLEX MICROSCOPIC
Glucose, UA: NEGATIVE mg/dL
Ketones, ur: NEGATIVE mg/dL
LEUKOCYTES UA: NEGATIVE
NITRITE: NEGATIVE
Protein, ur: 100 mg/dL — AB
pH: 5.5 (ref 5.0–8.0)

## 2015-04-05 LAB — INFLUENZA PANEL BY PCR (TYPE A & B)
H1N1 flu by pcr: NOT DETECTED
INFLAPCR: NEGATIVE
INFLBPCR: NEGATIVE

## 2015-04-05 LAB — URINE MICROSCOPIC-ADD ON
SQUAMOUS EPITHELIAL / LPF: NONE SEEN
WBC UA: NONE SEEN WBC/hpf (ref 0–5)

## 2015-04-05 LAB — BLOOD GAS, ARTERIAL
Acid-Base Excess: 1.5 mmol/L (ref 0.0–2.0)
Bicarbonate: 24.8 mEq/L — ABNORMAL HIGH (ref 20.0–24.0)
DELIVERY SYSTEMS: POSITIVE
DRAWN BY: 213101
Expiratory PAP: 8
FIO2: 0.5
INSPIRATORY PAP: 16
O2 Saturation: 99.1 %
PCO2 ART: 52.8 mmHg — AB (ref 35.0–45.0)
TCO2: 20.8 mmol/L (ref 0–100)
pH, Arterial: 7.327 — ABNORMAL LOW (ref 7.350–7.450)
pO2, Arterial: 229 mmHg — ABNORMAL HIGH (ref 80.0–100.0)

## 2015-04-05 LAB — CBC WITH DIFFERENTIAL/PLATELET
Basophils Absolute: 0 10*3/uL (ref 0.0–0.1)
Basophils Relative: 0 %
EOS ABS: 0.1 10*3/uL (ref 0.0–0.7)
EOS PCT: 1 %
HCT: 44.6 % (ref 36.0–46.0)
HEMOGLOBIN: 14.8 g/dL (ref 12.0–15.0)
LYMPHS ABS: 1.6 10*3/uL (ref 0.7–4.0)
Lymphocytes Relative: 17 %
MCH: 32 pg (ref 26.0–34.0)
MCHC: 33.2 g/dL (ref 30.0–36.0)
MCV: 96.3 fL (ref 78.0–100.0)
MONOS PCT: 9 %
Monocytes Absolute: 0.8 10*3/uL (ref 0.1–1.0)
NEUTROS PCT: 73 %
Neutro Abs: 6.5 10*3/uL (ref 1.7–7.7)
Platelets: 193 10*3/uL (ref 150–400)
RBC: 4.63 MIL/uL (ref 3.87–5.11)
RDW: 13.4 % (ref 11.5–15.5)
WBC: 9 10*3/uL (ref 4.0–10.5)

## 2015-04-05 LAB — BASIC METABOLIC PANEL
Anion gap: 10 (ref 5–15)
BUN: 16 mg/dL (ref 6–20)
CHLORIDE: 104 mmol/L (ref 101–111)
CO2: 29 mmol/L (ref 22–32)
CREATININE: 0.86 mg/dL (ref 0.44–1.00)
Calcium: 8.8 mg/dL — ABNORMAL LOW (ref 8.9–10.3)
GFR calc Af Amer: >60 mL/min (ref >60)
GFR calc non Af Amer: >60 mL/min (ref >60)
Glucose, Bld: 121 mg/dL — ABNORMAL HIGH (ref 65–99)
Potassium: 4 mmol/L (ref 3.5–5.1)
Sodium: 143 mmol/L (ref 135–145)

## 2015-04-05 LAB — CBC
HCT: 43.3 % (ref 36.0–46.0)
HEMOGLOBIN: 14.1 g/dL (ref 12.0–15.0)
MCH: 31.4 pg (ref 26.0–34.0)
MCHC: 32.6 g/dL (ref 30.0–36.0)
MCV: 96.4 fL (ref 78.0–100.0)
PLATELETS: 183 10*3/uL (ref 150–400)
RBC: 4.49 MIL/uL (ref 3.87–5.11)
RDW: 13.4 % (ref 11.5–15.5)
WBC: 6.9 10*3/uL (ref 4.0–10.5)

## 2015-04-05 LAB — CREATININE, SERUM: CREATININE: 0.8 mg/dL (ref 0.44–1.00)

## 2015-04-05 LAB — MRSA PCR SCREENING: MRSA by PCR: NEGATIVE

## 2015-04-05 LAB — STREP PNEUMONIAE URINARY ANTIGEN: STREP PNEUMO URINARY ANTIGEN: NEGATIVE

## 2015-04-05 MED ORDER — ONDANSETRON HCL 4 MG/2ML IJ SOLN
4.0000 mg | Freq: Four times a day (QID) | INTRAMUSCULAR | Status: DC | PRN
  Administered 2015-04-15: 4 mg via INTRAVENOUS
  Filled 2015-04-05: qty 2

## 2015-04-05 MED ORDER — ADULT MULTIVITAMIN W/MINERALS CH
1.0000 | ORAL_TABLET | Freq: Every day | ORAL | Status: DC
  Administered 2015-04-06 – 2015-04-16 (×10): 1 via ORAL
  Filled 2015-04-05 (×17): qty 1

## 2015-04-05 MED ORDER — ALBUTEROL SULFATE (2.5 MG/3ML) 0.083% IN NEBU
2.5000 mg | INHALATION_SOLUTION | RESPIRATORY_TRACT | Status: DC | PRN
  Administered 2015-04-14: 2.5 mg via RESPIRATORY_TRACT
  Filled 2015-04-05: qty 3

## 2015-04-05 MED ORDER — DEXTROSE 5 % IV SOLN
500.0000 mg | Freq: Once | INTRAVENOUS | Status: AC
  Administered 2015-04-05: 500 mg via INTRAVENOUS
  Filled 2015-04-05: qty 500

## 2015-04-05 MED ORDER — ENOXAPARIN SODIUM 40 MG/0.4ML ~~LOC~~ SOLN
40.0000 mg | SUBCUTANEOUS | Status: DC
  Administered 2015-04-05 – 2015-04-16 (×12): 40 mg via SUBCUTANEOUS
  Filled 2015-04-05 (×12): qty 0.4

## 2015-04-05 MED ORDER — FUROSEMIDE 10 MG/ML IJ SOLN
20.0000 mg | Freq: Every day | INTRAMUSCULAR | Status: DC
  Administered 2015-04-05 – 2015-04-06 (×2): 20 mg via INTRAVENOUS
  Filled 2015-04-05 (×2): qty 2

## 2015-04-05 MED ORDER — SODIUM CHLORIDE 0.9 % IJ SOLN: 3.0000 mL | INTRAMUSCULAR | Status: DC | PRN

## 2015-04-05 MED ORDER — ALBUTEROL (5 MG/ML) CONTINUOUS INHALATION SOLN: 10.0000 mg/h | INHALATION_SOLUTION | Freq: Once | RESPIRATORY_TRACT | Status: DC

## 2015-04-05 MED ORDER — IPRATROPIUM BROMIDE 0.02 % IN SOLN: 0.5000 mg | Freq: Four times a day (QID) | RESPIRATORY_TRACT | Status: DC

## 2015-04-05 MED ORDER — METHYLPREDNISOLONE SODIUM SUCC 125 MG IJ SOLR
80.0000 mg | Freq: Four times a day (QID) | INTRAMUSCULAR | Status: DC
  Administered 2015-04-05 – 2015-04-07 (×7): 80 mg via INTRAVENOUS
  Filled 2015-04-05 (×8): qty 2

## 2015-04-05 MED ORDER — ALBUTEROL SULFATE (2.5 MG/3ML) 0.083% IN NEBU: 2.5000 mg | INHALATION_SOLUTION | Freq: Four times a day (QID) | RESPIRATORY_TRACT | Status: DC

## 2015-04-05 MED ORDER — SODIUM CHLORIDE 0.9 % IV SOLN: 250.0000 mL | INTRAVENOUS | Status: DC | PRN

## 2015-04-05 MED ORDER — TRAZODONE HCL 50 MG PO TABS
100.0000 mg | ORAL_TABLET | Freq: Every day | ORAL | Status: DC
  Administered 2015-04-05 – 2015-04-15 (×11): 100 mg via ORAL
  Filled 2015-04-05 (×11): qty 2

## 2015-04-05 MED ORDER — FAMOTIDINE 20 MG PO TABS
10.0000 mg | ORAL_TABLET | Freq: Two times a day (BID) | ORAL | Status: DC
  Administered 2015-04-05 – 2015-04-16 (×21): 10 mg via ORAL
  Filled 2015-04-05 (×24): qty 1

## 2015-04-05 MED ORDER — METHYLPREDNISOLONE SODIUM SUCC 125 MG IJ SOLR
INTRAMUSCULAR | Status: AC
  Filled 2015-04-05: qty 2

## 2015-04-05 MED ORDER — ACETAMINOPHEN 650 MG RE SUPP: 650.0000 mg | Freq: Four times a day (QID) | RECTAL | Status: DC | PRN

## 2015-04-05 MED ORDER — ONDANSETRON HCL 4 MG PO TABS: 4.0000 mg | ORAL_TABLET | Freq: Four times a day (QID) | ORAL | Status: DC | PRN

## 2015-04-05 MED ORDER — IPRATROPIUM-ALBUTEROL 0.5-2.5 (3) MG/3ML IN SOLN
3.0000 mL | Freq: Four times a day (QID) | RESPIRATORY_TRACT | Status: DC
  Administered 2015-04-05 – 2015-04-14 (×38): 3 mL via RESPIRATORY_TRACT
  Filled 2015-04-05 (×39): qty 3

## 2015-04-05 MED ORDER — DEXTROSE 5 % IV SOLN
500.0000 mg | INTRAVENOUS | Status: DC
  Administered 2015-04-06 – 2015-04-07 (×2): 500 mg via INTRAVENOUS
  Filled 2015-04-05 (×2): qty 500

## 2015-04-05 MED ORDER — SENNOSIDES-DOCUSATE SODIUM 8.6-50 MG PO TABS
1.0000 | ORAL_TABLET | Freq: Every evening | ORAL | Status: DC | PRN
  Administered 2015-04-08 – 2015-04-11 (×3): 1 via ORAL
  Filled 2015-04-05 (×3): qty 1

## 2015-04-05 MED ORDER — ATORVASTATIN CALCIUM 10 MG PO TABS
10.0000 mg | ORAL_TABLET | Freq: Every day | ORAL | Status: DC
  Administered 2015-04-06 – 2015-04-16 (×10): 10 mg via ORAL
  Filled 2015-04-05 (×12): qty 1

## 2015-04-05 MED ORDER — METHYLPREDNISOLONE SODIUM SUCC 125 MG IJ SOLR
125.0000 mg | Freq: Once | INTRAMUSCULAR | Status: AC
  Administered 2015-04-05: 125 mg via INTRAVENOUS

## 2015-04-05 MED ORDER — SODIUM CHLORIDE 0.9 % IJ SOLN
3.0000 mL | Freq: Two times a day (BID) | INTRAMUSCULAR | Status: DC
Start: 2015-04-05 — End: 2015-04-16
  Administered 2015-04-05 – 2015-04-16 (×21): 3 mL via INTRAVENOUS

## 2015-04-05 MED ORDER — ACETAMINOPHEN 325 MG PO TABS: 650.0000 mg | ORAL_TABLET | Freq: Four times a day (QID) | ORAL | Status: DC | PRN

## 2015-04-05 MED ORDER — ALBUTEROL (5 MG/ML) CONTINUOUS INHALATION SOLN
10.0000 mg/h | INHALATION_SOLUTION | Freq: Once | RESPIRATORY_TRACT | Status: AC
  Administered 2015-04-05: 10 mg/h via RESPIRATORY_TRACT
  Filled 2015-04-05: qty 20

## 2015-04-05 MED ORDER — DEXTROSE 5 % IV SOLN
1.0000 g | Freq: Once | INTRAVENOUS | Status: AC
  Administered 2015-04-05: 1 g via INTRAVENOUS
  Filled 2015-04-05: qty 10

## 2015-04-05 MED ORDER — DEXTROSE 5 % IV SOLN
1.0000 g | INTRAVENOUS | Status: DC
  Administered 2015-04-06 – 2015-04-07 (×2): 1 g via INTRAVENOUS
  Filled 2015-04-05 (×2): qty 10

## 2015-04-05 MED ORDER — DILTIAZEM HCL ER COATED BEADS 180 MG PO CP24
300.0000 mg | ORAL_CAPSULE | Freq: Every day | ORAL | Status: DC
  Administered 2015-04-06 – 2015-04-16 (×10): 300 mg via ORAL
  Filled 2015-04-05 (×12): qty 1

## 2015-04-05 MED ORDER — ASPIRIN EC 81 MG PO TBEC
81.0000 mg | DELAYED_RELEASE_TABLET | Freq: Every day | ORAL | Status: DC
  Administered 2015-04-06 – 2015-04-16 (×10): 81 mg via ORAL
  Filled 2015-04-05 (×12): qty 1

## 2015-04-05 NOTE — Consult Note (Signed)
Consult requested by: Dr. Ardyth Harps Consult requested for respiratory failure:  HPI: This is a 65 year old who was brought from home by EMS with respiratory distress. Not much history is really available. She was brought in started on BiPAP. She does have COPD at baseline. She has a history of supraventricular tachycardia in the past. Despite BiPAP she still has respiratory rate of about 30 and is not moving air very well. She is able to answer questions yes or no and says that she's been coughing and congested but is not sure she's had any fever or chills  Past Medical History  Diagnosis Date  . COPD (chronic obstructive pulmonary disease) (HCC)     and deviated septum  . SVT (supraventricular tachycardia) (HCC)     Normal EF by echo  . Tobacco abuse      Family History  Problem Relation Age of Onset  . Lung cancer       Social History   Social History  . Marital Status: Divorced    Spouse Name: N/A  . Number of Children: N/A  . Years of Education: N/A   Social History Main Topics  . Smoking status: Former Smoker -- 1.00 packs/day for 44 years    Types: Cigarettes    Start date: 09/05/1966    Quit date: 05/23/2011  . Smokeless tobacco: Never Used     Comment: started smoking age 33; Socially smokes now.  . Alcohol Use: No  . Drug Use: No  . Sexual Activity: Not Asked   Other Topics Concern  . None   Social History Narrative     ROS: Except as noted above not obtainable    Objective: Vital signs in last 24 hours: Temp:  [98.9 F (37.2 C)] 98.9 F (37.2 C) (01/02 0559) Pulse Rate:  [91-118] 98 (01/02 0950) Resp:  [17-36] 34 (01/02 0950) BP: (116-125)/(65-93) 119/76 mmHg (01/02 0905) SpO2:  [95 %-100 %] 100 % (01/02 0950) FiO2 (%):  [50 %] 50 % (01/02 0905) Weight:  [76.658 kg (169 lb)] 76.658 kg (169 lb) (01/02 0513) Weight change:     Intake/Output from previous day:    PHYSICAL EXAM She is an obese female in some respiratory distress despite BiPAP.  Her pupils react. Mucous membranes are slightly dry. Her neck is supple. Her chest shows diminished breath sounds bilaterally and she's not moving air very well yet. Her heart is regular without gallop. Her abdomen is soft and obese without masses bowel sounds are present extremities show trace edema and central nervous system exam shows that she is having respiratory distress but is grossly intact  Lab Results: Basic Metabolic Panel:  Recent Labs  16/10/96 0516  NA 143  K 4.0  CL 104  CO2 29  GLUCOSE 121*  BUN 16  CREATININE 0.86  CALCIUM 8.8*   Liver Function Tests: No results for input(s): AST, ALT, ALKPHOS, BILITOT, PROT, ALBUMIN in the last 72 hours. No results for input(s): LIPASE, AMYLASE in the last 72 hours. No results for input(s): AMMONIA in the last 72 hours. CBC:  Recent Labs  04/05/15 0516  WBC 9.0  NEUTROABS 6.5  HGB 14.8  HCT 44.6  MCV 96.3  PLT 193   Cardiac Enzymes: No results for input(s): CKTOTAL, CKMB, CKMBINDEX, TROPONINI in the last 72 hours. BNP: No results for input(s): PROBNP in the last 72 hours. D-Dimer: No results for input(s): DDIMER in the last 72 hours. CBG: No results for input(s): GLUCAP in the last 72 hours. Hemoglobin  A1C: No results for input(s): HGBA1C in the last 72 hours. Fasting Lipid Panel: No results for input(s): CHOL, HDL, LDLCALC, TRIG, CHOLHDL, LDLDIRECT in the last 72 hours. Thyroid Function Tests: No results for input(s): TSH, T4TOTAL, FREET4, T3FREE, THYROIDAB in the last 72 hours. Anemia Panel: No results for input(s): VITAMINB12, FOLATE, FERRITIN, TIBC, IRON, RETICCTPCT in the last 72 hours. Coagulation: No results for input(s): LABPROT, INR in the last 72 hours. Urine Drug Screen: Drugs of Abuse  No results found for: LABOPIA, COCAINSCRNUR, LABBENZ, AMPHETMU, THCU, LABBARB  Alcohol Level: No results for input(s): ETH in the last 72 hours. Urinalysis:  Recent Labs  04/05/15 0600  COLORURINE YELLOW   LABSPEC >1.030*  PHURINE 5.5  GLUCOSEU NEGATIVE  HGBUR TRACE*  BILIRUBINUR SMALL*  KETONESUR NEGATIVE  PROTEINUR 100*  NITRITE NEGATIVE  LEUKOCYTESUR NEGATIVE   Misc. Labs:   ABGS:  Recent Labs  04/05/15 0530  PHART 7.327*  PO2ART 229*  TCO2 20.8  HCO3 24.8*     MICROBIOLOGY: No results found for this or any previous visit (from the past 240 hour(s)).  Studies/Results: Dg Chest Portable 1 View  04/05/2015  CLINICAL DATA:  Acute onset of respiratory distress. Generalized weakness and chest pressure. Initial encounter. EXAM: PORTABLE CHEST 1 VIEW COMPARISON:  None. FINDINGS: The lungs are hyperexpanded, with flattening of the hemidiaphragms, raising concern for COPD. Mild bibasilar opacities may reflect mild interstitial edema or pneumonia. Mild vascular congestion is noted. There is no evidence of pleural effusion or pneumothorax. The cardiomediastinal silhouette is borderline normal in size. No acute osseous abnormalities are seen. IMPRESSION: 1. Mild bibasilar airspace opacities may reflect mild interstitial edema or pneumonia. Mild vascular congestion noted. 2. Findings of COPD. Electronically Signed   By: Roanna RaiderJeffery  Chang M.D.   On: 04/05/2015 05:55    Medications:  Prior to Admission:  Prescriptions prior to admission  Medication Sig Dispense Refill Last Dose  . albuterol (PROAIR HFA) 108 (90 BASE) MCG/ACT inhaler Inhale 2 puffs into the lungs every 6 (six) hours as needed for wheezing or shortness of breath.   Taking  . aspirin EC 81 MG tablet Take 81 mg by mouth daily.   Taking  . atorvastatin (LIPITOR) 10 MG tablet Take 10 mg by mouth daily.   Taking  . calcium carbonate (TUMS - DOSED IN MG ELEMENTAL CALCIUM) 500 MG chewable tablet Chew 1 tablet by mouth as needed.   Taking  . diltiazem (CARDIZEM CD) 300 MG 24 hr capsule Take 1 capsule (300 mg total) by mouth daily. 30 capsule 11   . famotidine (PEPCID) 10 MG tablet Take 10 mg by mouth 2 (two) times daily.   Taking  .  furosemide (LASIX) 20 MG tablet Take 1 tab daily as needed for swelling 90 tablet 3   . ipratropium-albuterol (DUONEB) 0.5-2.5 (3) MG/3ML SOLN Take 3 mLs by nebulization 4 (four) times daily.    Taking  . Multiple Vitamin (MULTIVITAMIN) tablet Take 1 tablet by mouth daily.   Taking  . traZODone (DESYREL) 100 MG tablet Take 100 mg by mouth at bedtime.   Taking   Scheduled: Continuous: PRN:  Assesment: She was admitted with presumed COPD exacerbation and acute on chronic respiratory failure. She is on BiPAP. She apparently had a dose of steroids at home but has not had any since. Chest x-ray suggestive of pneumonia versus CHF. She has only trace peripheral edema. Active Problems:   COPD exacerbation (HCC)   Acute on chronic respiratory failure (HCC)  Plan: Agree with steroids antibiotics and inhaled bronchodilators. I think it's likely that she will need intubation and mechanical ventilation but if she improves with treatment we may be able to avoid that.    LOS: 0 days   Haziel Molner L 04/05/2015, 10:09 AM

## 2015-04-05 NOTE — ED Provider Notes (Signed)
CSN: [id number]     Arrival date & time 04/05/15  0507 History   First MD Initiated Contact with Patient 04/05/15 0510     Chief Complaint  Patient presents with  . Respiratory Distress   LEVEL 5 CAVEAT DUE TO ACUITY OF CONDITION  Patient is a 65 y.o. female presenting with shortness of breath. The history is provided by the patient and the EMS personnel. The history is limited by the condition of the patient.  Shortness of Breath Severity:  Severe Onset quality:  Gradual Duration:  1 day Timing:  Constant Progression:  Worsening Chronicity:  New Relieved by:  Nothing Worsened by:  Nothing tried Associated symptoms: cough and wheezing   Risk factors: tobacco use   Risk factors comment:  H/O COPD Patient arrives via EMS for shortness of breath Per EMS, pt has had shortness of breath over past day When they arrived, she was having difficulty breathing with decreased air movement bilaterally She was given 3 duonebs, solumedrol and also placed on CPAP with some improvement  No other details are known as patient is in respiratory distress Past Medical History  Diagnosis Date  . COPD (chronic obstructive pulmonary disease) (HCC)     and deviated septum  . SVT (supraventricular tachycardia) (HCC)     Normal EF by echo  . Tobacco abuse    Past Surgical History  Procedure Laterality Date  . Tubal ligation    . Breast biopsy     Family History  Problem Relation Age of Onset  . Lung cancer     Social History  Substance Use Topics  . Smoking status: Former Smoker -- 1.00 packs/day for 44 years    Types: Cigarettes    Start date: 09/05/1966    Quit date: 05/23/2011  . Smokeless tobacco: Never Used     Comment: started smoking age 48; Socially smokes now.  . Alcohol Use: No   OB History    No data available     Review of Systems  Unable to perform ROS: Acuity of condition  Respiratory: Positive for cough, shortness of breath and wheezing.       Allergies  Review of  patient's allergies indicates no known allergies.  Home Medications   Prior to Admission medications   Medication Sig Start Date End Date Taking? Authorizing Provider  albuterol (PROAIR HFA) 108 (90 BASE) MCG/ACT inhaler Inhale 2 puffs into the lungs every 6 (six) hours as needed for wheezing or shortness of breath.    Historical Provider, MD  aspirin EC 81 MG tablet Take 81 mg by mouth daily.    Historical Provider, MD  atorvastatin (LIPITOR) 10 MG tablet Take 10 mg by mouth daily.    Historical Provider, MD  calcium carbonate (TUMS - DOSED IN MG ELEMENTAL CALCIUM) 500 MG chewable tablet Chew 1 tablet by mouth as needed.    Historical Provider, MD  diltiazem (CARDIZEM CD) 300 MG 24 hr capsule Take 1 capsule (300 mg total) by mouth daily. 03/23/15   Antoine Poche, MD  famotidine (PEPCID) 10 MG tablet Take 10 mg by mouth 2 (two) times daily.    Historical Provider, MD  furosemide (LASIX) 20 MG tablet Take 1 tab daily as needed for swelling 03/23/15   Antoine Poche, MD  ipratropium-albuterol (DUONEB) 0.5-2.5 (3) MG/3ML SOLN Take 3 mLs by nebulization 4 (four) times daily.     Historical Provider, MD  Multiple Vitamin (MULTIVITAMIN) tablet Take 1 tablet by mouth daily.  Historical Provider, MD  traZODone (DESYREL) 100 MG tablet Take 100 mg by mouth at bedtime.    Historical Provider, MD  HR - 115 BP - 121/93 RR - 36 Sat - 100% T - 98.9 Physical Exam CONSTITUTIONAL: Disheveled, appears in distress HEAD: Normocephalic/atraumatic EYES: EOMI ENMT: Mucous membranes moist NECK: supple no meningeal signs CV: tachycardic, no murmurs/rubs/gallops noted LUNGS: decreased breath sounds bilaterally, wheezing noted, tachypnea and distress noted ABDOMEN: soft, nontender NEURO: Pt is awake/alert, maex4 EXTREMITIES: pulses normal/equal, full ROM, minimal pitting edema to lower extremities SKIN: warm, color normal PSYCH: anxious  ED Course  Procedures  CRITICAL CARE Performed by:  Joya GaskinsWICKLINE,Willys Salvino W Total critical care time: 35 minutes Critical care time was exclusive of separately billable procedures and treating other patients. Critical care was necessary to treat or prevent imminent or life-threatening deterioration. Critical care was time spent personally by me on the following activities: development of treatment plan with patient and/or surrogate as well as nursing, discussions with consultants, evaluation of patient's response to treatment, examination of patient, obtaining history from patient or surrogate, ordering and performing treatments and interventions, ordering and review of laboratory studies, ordering and review of radiographic studies, pulse oximetry and re-evaluation of patient's condition. PATIENT WITH ACUTE RESPIRATORY REQUIRING  BIPAP  Labs Review Labs Reviewed  BASIC METABOLIC PANEL - Abnormal; Notable for the following:    Glucose, Bld 121 (*)    Calcium 8.8 (*)    All other components within normal limits  BLOOD GAS, ARTERIAL - Abnormal; Notable for the following:    pH, Arterial 7.327 (*)    pCO2 arterial 52.8 (*)    pO2, Arterial 229 (*)    Bicarbonate 24.8 (*)    All other components within normal limits  URINALYSIS, ROUTINE W REFLEX MICROSCOPIC (NOT AT [facility]) - Abnormal; Notable for the following:    Specific Gravity, Urine >1.030 (*)    Hgb urine dipstick TRACE (*)    Bilirubin Urine SMALL (*)    Protein, ur 100 (*)    All other components within normal limits  URINE MICROSCOPIC-ADD ON - Abnormal; Notable for the following:    Bacteria, UA MANY (*)    All other components within normal limits  URINE CULTURE  CBC WITH DIFFERENTIAL/PLATELET    Imaging Review Dg Chest Portable 1 View  04/05/2015  CLINICAL DATA:  Acute onset of respiratory distress. Generalized weakness and chest pressure. Initial encounter. EXAM: PORTABLE CHEST 1 VIEW COMPARISON:  None. FINDINGS: The lungs are hyperexpanded, with flattening of the hemidiaphragms,  raising concern for COPD. Mild bibasilar opacities may reflect mild interstitial edema or pneumonia. Mild vascular congestion is noted. There is no evidence of pleural effusion or pneumothorax. The cardiomediastinal silhouette is borderline normal in size. No acute osseous abnormalities are seen. IMPRESSION: 1. Mild bibasilar airspace opacities may reflect mild interstitial edema or pneumonia. Mild vascular congestion noted. 2. Findings of COPD. Electronically Signed   By: Roanna RaiderJeffery  Chang M.D.   On: 04/05/2015 05:55   I have personally reviewed and evaluated these images and lab results as part of my medical decision-making.   EKG Interpretation   Date/Time:  Monday April 05 2015 05:16:26 EST Ventricular Rate:  117 PR Interval:  151 QRS Duration: 84 QT Interval:  313 QTC Calculation: 437 R Axis:   87 Text Interpretation:  Sinus tachycardia Borderline right axis deviation No  previous ECGs available Confirmed by Bebe ShaggyWICKLINE  MD, Tylea Hise ([id number]) on  04/05/2015 5:22:47 AM     Medications  cefTRIAXone (  ROCEPHIN) 1 g in dextrose 5 % 50 mL IVPB (not administered)  azithromycin (ZITHROMAX) 500 mg in dextrose 5 % 250 mL IVPB (not administered)  albuterol (PROVENTIL,VENTOLIN) solution continuous neb (10 mg/hr Nebulization Given 04/05/15 0527)   5:33 AM Pt seen on arrival in severe distress She was transitioned to BIPAP And appears to be improving She requests foley catheter - this seems reasonable as she is critically ill and would like to limit her movement in/out of bed 5:40 AM Pt is feeling improved but she is refusing the albuterol nebs at this time  6:07 AM Pt improved CXR c/w possible pneumonia and antibiotics ordered Pt reports recent increase cough/sob.  She reports chest tightness with cough/wheezing She reports similar to prior COPD exacerbation but she feels this is the worse than previous BP 116/65 mmHg  Pulse 113  Temp(Src) 98.9 F (37.2 C) (Rectal)  Resp 29  Ht 5\' 9"  (1.753 m)   Wt 76.658 kg  BMI 24.95 kg/m2  SpO2 100% 6:30 AM D/W DR DAVID FOR ADMISSION TO STEPDOWN UNIT FOR COPD EXACERBATION  MDM   Final diagnoses:  Chronic obstructive pulmonary disease with acute exacerbation (HCC)  Acute respiratory failure, unspecified whether with hypoxia or hypercapnia (HCC)  Chronic obstructive pulmonary disease with acute lower respiratory infection (HCC)    Nursing notes including past medical history and social history reviewed and considered in documentation xrays/imaging reviewed by myself and considered during evaluation Labs/vital reviewed myself and considered during evaluation     Zadie Rhine, MD 04/05/15 [phone]

## 2015-04-05 NOTE — Progress Notes (Signed)
Medicine in cup and treatment in-line pt became anxious and stated she couldn't breath and that the BIPAP wasn't working with treatment. Treatment removed and DR informed and treatment on hold for now..Marland Kitchen

## 2015-04-05 NOTE — ED Provider Notes (Signed)
After discussion with myself and also nursing, pt does NOT want to be intubated and does NOT want CPR.  [facility]  Zadie Rhineonald Aashi Derrington, MD 04/05/15 [phone]

## 2015-04-05 NOTE — Progress Notes (Signed)
**Note De-Identified Jeovanny Cuadros Obfuscation** Patient tolerated transport on BIPAP to ICU from ED.  RRT to continue to monitor.

## 2015-04-05 NOTE — ED Notes (Signed)
Floor is ready for patient at this time. RT called for transport and will transfer patient when she arrives.

## 2015-04-05 NOTE — H&P (Signed)
[facility]          History and Physical    PCP:   SLATE,SCOTT, PA-C   EDP: Julious Oka, M.D.  Chief Complaint:  Shortness of breath  HPI: Patient is a 65 year old woman with history of COPD and what appears to be SVT per chart review. History is very limited given patient's acute condition. She was placed on BiPAP upon arrival to the ED. She continues to have significant increased work of breathing. Respiratory rate is about 30 and she is not moving air very well. She is able to answer some questions and states that she would like to be intubated as a "last resort". She does some that she has been coughing but does not believe she has had any fevers or chills. I am seeing her in the ICU. We are asked to admit her for further evaluation and management.  Allergies:  No Known Allergies    Past Medical History  Diagnosis Date  . COPD (chronic obstructive pulmonary disease) (HCC)     and deviated septum  . SVT (supraventricular tachycardia) (HCC)     Normal EF by echo  . Tobacco abuse     Past Surgical History  Procedure Laterality Date  . Tubal ligation    . Breast biopsy      Prior to Admission medications   Medication Sig Start Date End Date Taking? Authorizing Provider  albuterol (PROAIR HFA) 108 (90 BASE) MCG/ACT inhaler Inhale 2 puffs into the lungs every 6 (six) hours as needed for wheezing or shortness of breath.    Historical Provider, MD  aspirin EC 81 MG tablet Take 81 mg by mouth daily.    Historical Provider, MD  atorvastatin (LIPITOR) 10 MG tablet Take 10 mg by mouth daily.    Historical Provider, MD  calcium carbonate (TUMS - DOSED IN MG ELEMENTAL CALCIUM) 500 MG chewable tablet Chew 1 tablet by mouth as needed.    Historical Provider, MD  diltiazem (CARDIZEM CD) 300 MG 24 hr capsule Take 1 capsule (300 mg total) by mouth daily. 03/23/15   Arnoldo Lenis, MD  famotidine (PEPCID) 10 MG tablet Take 10 mg by mouth 2 (two) times daily.     Historical Provider, MD  furosemide (LASIX) 20 MG tablet Take 1 tab daily as needed for swelling 03/23/15   Arnoldo Lenis, MD  ipratropium-albuterol (DUONEB) 0.5-2.5 (3) MG/3ML SOLN Take 3 mLs by nebulization 4 (four) times daily.     Historical Provider, MD  Multiple Vitamin (MULTIVITAMIN) tablet Take 1 tablet by mouth daily.    Historical Provider, MD  traZODone (DESYREL) 100 MG tablet Take 100 mg by mouth at bedtime.    Historical Provider, MD    Social History:  reports that she quit smoking about 3 years ago. Her smoking use included Cigarettes. She started smoking about 48 years ago. She has a 44 pack-year smoking history. She has never used smokeless tobacco. She reports that she does not drink alcohol or use illicit drugs.  Family History  Problem Relation Age of Onset  . Lung cancer      Review of Systems:  Unobtainable given current mental state  Physical Exam: Blood pressure 119/76, pulse 98, temperature 98.9 F (37.2 C), temperature source Rectal, resp. rate 34, height _0  (1.753 m), weight 76.658 kg (169 lb), SpO2 100 %. General: Awake, alert, significant increased work of breathing HEENT: Normocephalic, atraumatic, pupils  equal round reactive to light, extraocular movements intact Neck: Supple, no JVD, lymphadenopathy, no goiter Cardiovascular: Regular rate and rhythm, do not auscultate any murmurs Lungs: Decreased air movement bilaterally, tachypneic Abdomen: Soft, nontender, nondistended, positive bowel sounds  Extremities: 1+ pitting edema bilaterally Neurologic: Limited by acute illness but appears nonfocal and moves all 4 spontaneously  Labs on Admission:  Results for orders placed or performed during the hospital encounter of 04/05/15 (from the past 48 hour(s))  Basic metabolic panel     Status: Abnormal   Collection Time: 04/05/15  5:16 AM  Result Value Ref Range   Sodium 143 135 - 145 mmol/L   Potassium 4.0 3.5 - 5.1 mmol/L   Chloride 104 101 - 111  mmol/L   CO2 29 22 - 32 mmol/L   Glucose, Bld 121 (H) 65 - 99 mg/dL   BUN 16 6 - 20 mg/dL   Creatinine, Ser 0.86 0.44 - 1.00 mg/dL   Calcium 8.8 (L) 8.9 - 10.3 mg/dL   GFR calc non Af Amer >60 >60 mL/min   GFR calc Af Amer >60 >60 mL/min    Comment: (NOTE) The eGFR has been calculated using the CKD EPI equation. This calculation has not been validated in all clinical situations. eGFR's persistently <60 mL/min signify possible Chronic Kidney Disease.    Anion gap 10 5 - 15  CBC with Differential/Platelet     Status: None   Collection Time: 04/05/15  5:16 AM  Result Value Ref Range   WBC 9.0 4.0 - 10.5 K/uL   RBC 4.63 3.87 - 5.11 MIL/uL   Hemoglobin 14.8 12.0 - 15.0 g/dL   HCT 44.6 36.0 - 46.0 %   MCV 96.3 78.0 - 100.0 fL   MCH 32.0 26.0 - 34.0 pg   MCHC 33.2 30.0 - 36.0 g/dL   RDW 13.4 11.5 - 15.5 %   Platelets 193 150 - 400 K/uL   Neutrophils Relative % 73 %   Neutro Abs 6.5 1.7 - 7.7 K/uL   Lymphocytes Relative 17 %   Lymphs Abs 1.6 0.7 - 4.0 K/uL   Monocytes Relative 9 %   Monocytes Absolute 0.8 0.1 - 1.0 K/uL   Eosinophils Relative 1 %   Eosinophils Absolute 0.1 0.0 - 0.7 K/uL   Basophils Relative 0 %   Basophils Absolute 0.0 0.0 - 0.1 K/uL  Blood gas, arterial     Status: Abnormal   Collection Time: 04/05/15  5:30 AM  Result Value Ref Range   FIO2 0.50    Delivery systems BILEVEL POSITIVE AIRWAY PRESSURE    Inspiratory PAP 16.0    Expiratory PAP 8    pH, Arterial 7.327 (L) 7.350 - 7.450   pCO2 arterial 52.8 (H) 35.0 - 45.0 mmHg   pO2, Arterial 229 (H) 80.0 - 100.0 mmHg   Bicarbonate 24.8 (H) 20.0 - 24.0 mEq/L   TCO2 20.8 0 - 100 mmol/L   Acid-Base Excess 1.5 0.0 - 2.0 mmol/L   O2 Saturation 99.1 %   Collection site LEFT RADIAL    Drawn by [id number]    Sample type ARTERIAL DRAW    Allens test (pass/fail) PASS PASS  Urinalysis, Routine w reflex microscopic (not at [facility])     Status: Abnormal   Collection Time: 04/05/15  6:00 AM  Result Value Ref Range   Color,  Urine YELLOW YELLOW   APPearance CLEAR CLEAR   Specific Gravity, Urine >1.030 (H) 1.005 - 1.030   pH 5.5 5.0 - 8.0  Glucose, UA NEGATIVE NEGATIVE mg/dL   Hgb urine dipstick TRACE (A) NEGATIVE   Bilirubin Urine SMALL (A) NEGATIVE   Ketones, ur NEGATIVE NEGATIVE mg/dL   Protein, ur 100 (A) NEGATIVE mg/dL   Nitrite NEGATIVE NEGATIVE   Leukocytes, UA NEGATIVE NEGATIVE  Urine microscopic-add on     Status: Abnormal   Collection Time: 04/05/15  6:00 AM  Result Value Ref Range   Squamous Epithelial / LPF NONE SEEN NONE SEEN   WBC, UA NONE SEEN 0 - 5 WBC/hpf   RBC / HPF 0-5 0 - 5 RBC/hpf   Bacteria, UA MANY (A) NONE SEEN    Radiological Exams on Admission: Dg Chest Portable 1 View  04/05/2015  CLINICAL DATA:  Acute onset of respiratory distress. Generalized weakness and chest pressure. Initial encounter. EXAM: PORTABLE CHEST 1 VIEW COMPARISON:  None. FINDINGS: The lungs are hyperexpanded, with flattening of the hemidiaphragms, raising concern for COPD. Mild bibasilar opacities may reflect mild interstitial edema or pneumonia. Mild vascular congestion is noted. There is no evidence of pleural effusion or pneumothorax. The cardiomediastinal silhouette is borderline normal in size. No acute osseous abnormalities are seen. IMPRESSION: 1. Mild bibasilar airspace opacities may reflect mild interstitial edema or pneumonia. Mild vascular congestion noted. 2. Findings of COPD. Electronically Signed   By: Garald Balding M.D.   On: 04/05/2015 05:55    Assessment/Plan Principal Problem:   Acute on chronic respiratory failure (HCC) Active Problems:   COPD exacerbation (HCC)   CAP (community acquired pneumonia)    Acute on chronic respiratory failure  -Likely multifactorial: COPD with acute exacerbation, probable community-acquired pneumonia, probable congestive heart failure. -Continue BiPAP for now, optimize medical therapy. -Suspect will need mechanical ventilation in the next few hours.  COPD  with acute exacerbation -Poor air movement. -We'll treat with steroids, frequent nebs, antibiotics.  Probable community-acquired pneumonia  -Culture data requested. -Continue Rocephin and azithromycin started in the ED. -Strep pneumo and legionella urine antigens requested.  Probable acute diastolic CHF -Most recent echo in November 2015 shows normal ejection fraction with grade 1 diastolic dysfunction. -We'll start Lasix, strict intake and output, daily weights. Next an-we'll request repeat echo.  DVT prophylaxis -Lovenox  CODE STATUS -Full code   Time Spent on Admission: 90 minutes  HERNANDEZ ACOSTA,ESTELA [facility] Pager: [phone] 04/05/2015, 10:17 AM

## 2015-04-05 NOTE — ED Notes (Signed)
Report given to Sherry RN

## 2015-04-05 NOTE — ED Notes (Signed)
[facility].  Pt admits to COPD

## 2015-04-06 LAB — LEGIONELLA ANTIGEN, URINE

## 2015-04-06 LAB — BASIC METABOLIC PANEL
ANION GAP: 9 (ref 5–15)
BUN: 26 mg/dL — AB (ref 6–20)
CHLORIDE: 104 mmol/L (ref 101–111)
CO2: 28 mmol/L (ref 22–32)
Calcium: 8.7 mg/dL — ABNORMAL LOW (ref 8.9–10.3)
Creatinine, Ser: 0.81 mg/dL (ref 0.44–1.00)
GFR calc Af Amer: >60 mL/min (ref >60)
GLUCOSE: 178 mg/dL — AB (ref 65–99)
POTASSIUM: 3.9 mmol/L (ref 3.5–5.1)
Sodium: 141 mmol/L (ref 135–145)

## 2015-04-06 LAB — URINE CULTURE: Culture: NO GROWTH

## 2015-04-06 LAB — CBC
HEMATOCRIT: 41.2 % (ref 36.0–46.0)
HEMOGLOBIN: 13.4 g/dL (ref 12.0–15.0)
MCH: 31.2 pg (ref 26.0–34.0)
MCHC: 32.5 g/dL (ref 30.0–36.0)
MCV: 96 fL (ref 78.0–100.0)
Platelets: 188 10*3/uL (ref 150–400)
RBC: 4.29 MIL/uL (ref 3.87–5.11)
RDW: 13.2 % (ref 11.5–15.5)
WBC: 4.8 10*3/uL (ref 4.0–10.5)

## 2015-04-06 LAB — HIV ANTIBODY (ROUTINE TESTING W REFLEX): HIV SCREEN 4TH GENERATION: NONREACTIVE

## 2015-04-06 MED ORDER — PNEUMOCOCCAL VAC POLYVALENT 25 MCG/0.5ML IJ INJ
0.5000 mL | INJECTION | INTRAMUSCULAR | Status: DC
  Filled 2015-04-06: qty 0.5

## 2015-04-06 NOTE — Consult Note (Signed)
Subjective: She feels better. She has no new complaints. Her breathing is doing better. She still on BiPAP. She says she's having trouble with her legs being crampy  Objective: Vital signs in last 24 hours: Temp:  [97.2 F (36.2 C)-97.7 F (36.5 C)] 97.6 F (36.4 C) (01/03 0400) Pulse Rate:  [67-112] 83 (01/03 0500) Resp:  [17-36] 22 (01/03 0500) BP: (94-119)/(65-82) 114/69 mmHg (01/03 0500) SpO2:  [95 %-100 %] 98 % (01/03 0500) FiO2 (%):  [40 %-50 %] 40 % (01/03 0212) Weight:  [85.6 kg (188 lb 11.4 oz)] 85.6 kg (188 lb 11.4 oz) (01/03 0500) Weight change: 8.942 kg (19 lb 11.4 oz)    Intake/Output from previous day: 01/02 0701 - 01/03 0700 In: 60 [I.V.:60] Out: 1050 [Urine:1050]  PHYSICAL EXAM General appearance: alert, cooperative, mild distress and On BiPAP and her respiratory rate is still around 18 Resp: She is moving air much better. She has end expiratory wheezes and prolonged expiratory phase Cardio: regular rate and rhythm, S1, S2 normal, no murmur, click, rub or gallop GI: soft, non-tender; bowel sounds normal; no masses,  no organomegaly Extremities: extremities normal, atraumatic, no cyanosis or edema  Lab Results:  Results for orders placed or performed during the hospital encounter of 04/05/15 (from the past 48 hour(s))  Basic metabolic panel     Status: Abnormal   Collection Time: 04/05/15  5:16 AM  Result Value Ref Range   Sodium 143 135 - 145 mmol/L   Potassium 4.0 3.5 - 5.1 mmol/L   Chloride 104 101 - 111 mmol/L   CO2 29 22 - 32 mmol/L   Glucose, Bld 121 (H) 65 - 99 mg/dL   BUN 16 6 - 20 mg/dL   Creatinine, Ser 0.86 0.44 - 1.00 mg/dL   Calcium 8.8 (L) 8.9 - 10.3 mg/dL   GFR calc non Af Amer >60 >60 mL/min   GFR calc Af Amer >60 >60 mL/min    Comment: (NOTE) The eGFR has been calculated using the CKD EPI equation. This calculation has not been validated in all clinical situations. eGFR's persistently <60 mL/min signify possible Chronic  Kidney Disease.    Anion gap 10 5 - 15  CBC with Differential/Platelet     Status: None   Collection Time: 04/05/15  5:16 AM  Result Value Ref Range   WBC 9.0 4.0 - 10.5 K/uL   RBC 4.63 3.87 - 5.11 MIL/uL   Hemoglobin 14.8 12.0 - 15.0 g/dL   HCT 44.6 36.0 - 46.0 %   MCV 96.3 78.0 - 100.0 fL   MCH 32.0 26.0 - 34.0 pg   MCHC 33.2 30.0 - 36.0 g/dL   RDW 13.4 11.5 - 15.5 %   Platelets 193 150 - 400 K/uL   Neutrophils Relative % 73 %   Neutro Abs 6.5 1.7 - 7.7 K/uL   Lymphocytes Relative 17 %   Lymphs Abs 1.6 0.7 - 4.0 K/uL   Monocytes Relative 9 %   Monocytes Absolute 0.8 0.1 - 1.0 K/uL   Eosinophils Relative 1 %   Eosinophils Absolute 0.1 0.0 - 0.7 K/uL   Basophils Relative 0 %   Basophils Absolute 0.0 0.0 - 0.1 K/uL  Blood gas, arterial     Status: Abnormal   Collection Time: 04/05/15  5:30 AM  Result Value Ref Range   FIO2 0.50    Delivery systems BILEVEL POSITIVE AIRWAY PRESSURE    Inspiratory PAP 16.0    Expiratory PAP 8    pH, Arterial 7.327 (  L) 7.350 - 7.450   pCO2 arterial 52.8 (H) 35.0 - 45.0 mmHg   pO2, Arterial 229 (H) 80.0 - 100.0 mmHg   Bicarbonate 24.8 (H) 20.0 - 24.0 mEq/L   TCO2 20.8 0 - 100 mmol/L   Acid-Base Excess 1.5 0.0 - 2.0 mmol/L   O2 Saturation 99.1 %   Collection site LEFT RADIAL    Drawn by [id number]    Sample type ARTERIAL DRAW    Allens test (pass/fail) PASS PASS  Urinalysis, Routine w reflex microscopic (not at [facility])     Status: Abnormal   Collection Time: 04/05/15  6:00 AM  Result Value Ref Range   Color, Urine YELLOW YELLOW   APPearance CLEAR CLEAR   Specific Gravity, Urine >1.030 (H) 1.005 - 1.030   pH 5.5 5.0 - 8.0   Glucose, UA NEGATIVE NEGATIVE mg/dL   Hgb urine dipstick TRACE (A) NEGATIVE   Bilirubin Urine SMALL (A) NEGATIVE   Ketones, ur NEGATIVE NEGATIVE mg/dL   Protein, ur 100 (A) NEGATIVE mg/dL   Nitrite NEGATIVE NEGATIVE   Leukocytes, UA NEGATIVE NEGATIVE  Urine microscopic-add on     Status: Abnormal   Collection Time:  04/05/15  6:00 AM  Result Value Ref Range   Squamous Epithelial / LPF NONE SEEN NONE SEEN   WBC, UA NONE SEEN 0 - 5 WBC/hpf   RBC / HPF 0-5 0 - 5 RBC/hpf   Bacteria, UA MANY (A) NONE SEEN  MRSA PCR Screening     Status: None   Collection Time: 04/05/15 10:00 AM  Result Value Ref Range   MRSA by PCR NEGATIVE NEGATIVE    Comment:        The GeneXpert MRSA Assay (FDA approved for NASAL specimens only), is one component of a comprehensive MRSA colonization surveillance program. It is not intended to diagnose MRSA infection nor to guide or monitor treatment for MRSA infections.   Culture, blood (routine x 2) Call MD if unable to obtain prior to antibiotics being given     Status: None (Preliminary result)   Collection Time: 04/05/15 10:30 AM  Result Value Ref Range   Specimen Description BLOOD LEFT ANTECUBITAL    Special Requests BOTTLES DRAWN AEROBIC AND ANAEROBIC 10CC EACH    Culture NO GROWTH < 12 HOURS    Report Status PENDING   HIV antibody     Status: None   Collection Time: 04/05/15 10:30 AM  Result Value Ref Range   HIV Screen 4th Generation wRfx Non Reactive Non Reactive    Comment: (NOTE) Performed At: [facility] [place], [place] [id number] Lindon Romp MD JJ:[id number]   CBC     Status: None   Collection Time: 04/05/15 10:30 AM  Result Value Ref Range   WBC 6.9 4.0 - 10.5 K/uL   RBC 4.49 3.87 - 5.11 MIL/uL   Hemoglobin 14.1 12.0 - 15.0 g/dL   HCT 43.3 36.0 - 46.0 %   MCV 96.4 78.0 - 100.0 fL   MCH 31.4 26.0 - 34.0 pg   MCHC 32.6 30.0 - 36.0 g/dL   RDW 13.4 11.5 - 15.5 %   Platelets 183 150 - 400 K/uL  Creatinine, serum     Status: None   Collection Time: 04/05/15 10:30 AM  Result Value Ref Range   Creatinine, Ser 0.80 0.44 - 1.00 mg/dL   GFR calc non Af Amer >60 >60 mL/min   GFR calc Af Amer >60 >60 mL/min    Comment: (NOTE) The  eGFR has been calculated using the CKD EPI equation. This calculation has not been validated in  all clinical situations. eGFR's persistently <60 mL/min signify possible Chronic Kidney Disease.   Culture, blood (routine x 2) Call MD if unable to obtain prior to antibiotics being given     Status: None (Preliminary result)   Collection Time: 04/05/15 10:36 AM  Result Value Ref Range   Specimen Description BLOOD RIGHT HAND    Special Requests BOTTLES DRAWN AEROBIC AND ANAEROBIC 10CC EACH    Culture NO GROWTH < 12 HOURS    Report Status PENDING   Strep pneumoniae urinary antigen     Status: None   Collection Time: 04/05/15 12:00 PM  Result Value Ref Range   Strep Pneumo Urinary Antigen NEGATIVE NEGATIVE    Comment:        Infection due to S. pneumoniae cannot be absolutely ruled out since the antigen present may be below the detection limit of the test. Performed at [facility]   Influenza panel by pcr     Status: None   Collection Time: 04/05/15 12:00 PM  Result Value Ref Range   Influenza A By PCR NEGATIVE NEGATIVE   Influenza B By PCR NEGATIVE NEGATIVE   H1N1 flu by pcr NOT DETECTED NOT DETECTED    Comment:        The Xpert Flu assay (FDA approved for nasal aspirates or washes and nasopharyngeal swab specimens), is intended as an aid in the diagnosis of influenza and should not be used as a sole basis for treatment.   Basic metabolic panel     Status: Abnormal   Collection Time: 04/06/15  4:51 AM  Result Value Ref Range   Sodium 141 135 - 145 mmol/L   Potassium 3.9 3.5 - 5.1 mmol/L   Chloride 104 101 - 111 mmol/L   CO2 28 22 - 32 mmol/L   Glucose, Bld 178 (H) 65 - 99 mg/dL   BUN 26 (H) 6 - 20 mg/dL   Creatinine, Ser 0.81 0.44 - 1.00 mg/dL   Calcium 8.7 (L) 8.9 - 10.3 mg/dL   GFR calc non Af Amer >60 >60 mL/min   GFR calc Af Amer >60 >60 mL/min    Comment: (NOTE) The eGFR has been calculated using the CKD EPI equation. This calculation has not been validated in all clinical situations. eGFR's persistently <60 mL/min signify possible Chronic  Kidney Disease.    Anion gap 9 5 - 15  CBC     Status: None   Collection Time: 04/06/15  4:51 AM  Result Value Ref Range   WBC 4.8 4.0 - 10.5 K/uL   RBC 4.29 3.87 - 5.11 MIL/uL   Hemoglobin 13.4 12.0 - 15.0 g/dL   HCT 41.2 36.0 - 46.0 %   MCV 96.0 78.0 - 100.0 fL   MCH 31.2 26.0 - 34.0 pg   MCHC 32.5 30.0 - 36.0 g/dL   RDW 13.2 11.5 - 15.5 %   Platelets 188 150 - 400 K/uL    ABGS  Recent Labs  04/05/15 0530  PHART 7.327*  PO2ART 229*  TCO2 20.8  HCO3 24.8*   CULTURES Recent Results (from the past 240 hour(s))  MRSA PCR Screening     Status: None   Collection Time: 04/05/15 10:00 AM  Result Value Ref Range Status   MRSA by PCR NEGATIVE NEGATIVE Final    Comment:        The GeneXpert MRSA Assay (FDA approved for NASAL  specimens only), is one component of a comprehensive MRSA colonization surveillance program. It is not intended to diagnose MRSA infection nor to guide or monitor treatment for MRSA infections.   Culture, blood (routine x 2) Call MD if unable to obtain prior to antibiotics being given     Status: None (Preliminary result)   Collection Time: 04/05/15 10:30 AM  Result Value Ref Range Status   Specimen Description BLOOD LEFT ANTECUBITAL  Final   Special Requests BOTTLES DRAWN AEROBIC AND ANAEROBIC 10CC EACH  Final   Culture NO GROWTH < 12 HOURS  Final   Report Status PENDING  Incomplete  Culture, blood (routine x 2) Call MD if unable to obtain prior to antibiotics being given     Status: None (Preliminary result)   Collection Time: 04/05/15 10:36 AM  Result Value Ref Range Status   Specimen Description BLOOD RIGHT HAND  Final   Special Requests BOTTLES DRAWN AEROBIC AND ANAEROBIC 10CC EACH  Final   Culture NO GROWTH < 12 HOURS  Final   Report Status PENDING  Incomplete   Studies/Results: Dg Chest Portable 1 View  04/05/2015  CLINICAL DATA:  Acute onset of respiratory distress. Generalized weakness and chest pressure. Initial encounter. EXAM:  PORTABLE CHEST 1 VIEW COMPARISON:  None. FINDINGS: The lungs are hyperexpanded, with flattening of the hemidiaphragms, raising concern for COPD. Mild bibasilar opacities may reflect mild interstitial edema or pneumonia. Mild vascular congestion is noted. There is no evidence of pleural effusion or pneumothorax. The cardiomediastinal silhouette is borderline normal in size. No acute osseous abnormalities are seen. IMPRESSION: 1. Mild bibasilar airspace opacities may reflect mild interstitial edema or pneumonia. Mild vascular congestion noted. 2. Findings of COPD. Electronically Signed   By: Garald Balding M.D.   On: 04/05/2015 05:55    Medications:  Prior to Admission:  Prescriptions prior to admission  Medication Sig Dispense Refill Last Dose  . albuterol (PROAIR HFA) 108 (90 BASE) MCG/ACT inhaler Inhale 2 puffs into the lungs every 6 (six) hours as needed for wheezing or shortness of breath.   Past Week at Unknown time  . aspirin EC 81 MG tablet Take 81 mg by mouth daily.   04/04/2015 at Unknown time  . atorvastatin (LIPITOR) 10 MG tablet Take 10 mg by mouth daily.   04/04/2015 at Unknown time  . calcium carbonate (TUMS - DOSED IN MG ELEMENTAL CALCIUM) 500 MG chewable tablet Chew 1 tablet by mouth as needed.   Past Week at Unknown time  . diltiazem (CARDIZEM CD) 300 MG 24 hr capsule Take 1 capsule (300 mg total) by mouth daily. 30 capsule 11 04/04/2015 at Unknown time  . famotidine (PEPCID) 10 MG tablet Take 10 mg by mouth 2 (two) times daily.   Past Week at Unknown time  . furosemide (LASIX) 20 MG tablet Take 1 tab daily as needed for swelling 90 tablet 3 Past Week at Unknown time  . ipratropium-albuterol (DUONEB) 0.5-2.5 (3) MG/3ML SOLN Take 3 mLs by nebulization 4 (four) times daily.    Past Week at Unknown time  . Multiple Vitamin (MULTIVITAMIN) tablet Take 1 tablet by mouth daily.   04/04/2015 at Unknown time  . traZODone (DESYREL) 100 MG tablet Take 100 mg by mouth at bedtime.   04/04/2015 at Unknown  time   Scheduled: . aspirin EC  81 mg Oral Daily  . atorvastatin  10 mg Oral Daily  . azithromycin  500 mg Intravenous Q24H  . cefTRIAXone (ROCEPHIN)  IV  1 g  Intravenous Q24H  . diltiazem  300 mg Oral Daily  . enoxaparin (LOVENOX) injection  40 mg Subcutaneous Q24H  . famotidine  10 mg Oral BID  . furosemide  20 mg Intravenous Daily  . ipratropium-albuterol  3 mL Nebulization Q6H  . methylPREDNISolone (SOLU-MEDROL) injection  80 mg Intravenous Q6H  . multivitamin with minerals  1 tablet Oral Daily  . sodium chloride  3 mL Intravenous Q12H  . traZODone  100 mg Oral QHS   Continuous:  RJP:VGKKDP chloride, acetaminophen **OR** acetaminophen, albuterol, ondansetron **OR** ondansetron (ZOFRAN) IV, senna-docusate, sodium chloride  Assesment: She has what appears to be community-acquired pneumonia COPD exacerbation and acute on chronic respiratory failure. She is currently still requiring BiPAP. She is on appropriate treatment for COPD and pneumonia. She is still having respiratory failure requiring BiPAP treatment. Her risk of needing intubation and mechanical ventilation is decreased from yesterday but still high. She did not have influenza or pneumococcus based on testing. She is still critically ill. Principal Problem:   Acute on chronic respiratory failure (HCC) Active Problems:   COPD exacerbation (HCC)   CAP (community acquired pneumonia)    Plan: Continue current treatments including IV steroids IV antibiotics and inhaled bronchodilators. See if she can come off BiPAP at least temporarily. I think she will probably require it at night at least for now..    LOS: 1 day   Salayah Meares L 04/06/2015, 7:42 AM

## 2015-04-06 NOTE — Progress Notes (Signed)
[facility] PROGRESS NOTE  Margaret Stokes ZOX:[id number] DOB: [birth year] DOA: 04/05/2015 PCP: Carlean Jews  Assessment/Plan: Acute on chronic respiratory failure -Multifactorial secondary to COPD with acute exacerbation, community-acquired pneumonia and diastolic CHF. -Although her risk for intubation has decreased it still remains high.  -Continue BiPAP throughout the day, wean as tolerated. -See below for details.  COPD with acute exacerbation -Improved air movement, no wheezes. -Continue steroids at current dose, nebs, antibiotics.  Community-acquired pneumonia -All culture data remains negative to date. -Continue Rocephin and azithromycin.  Acute on chronic diastolic CHF -Echo in November 2015 shows normal ejection fraction with grade 1 diastolic dysfunction. -Continue Lasix, she is almost 1 L negative since admission.  Code Status: Full code Family Communication: Patient only  Disposition Plan: Keep in ICU given tenuous respiratory status   Consultants:  Pulmonary   Antibiotics:  Rocephin  Azithromycin   Subjective: Breathing status has improved over the past 24 hours, she appears angry today asking why she is not getting fluids if she is dehydrated.  Objective: Filed Vitals:   04/06/15 0800 04/06/15 0900 04/06/15 0906 04/06/15 0922  BP: 112/77 109/77    Pulse: 86 83 96 97  Temp:      TempSrc:      Resp: 24 25 27 23   Height:      Weight:      SpO2: 98% 99% 95% 96%    Intake/Output Summary (Last 24 hours) at 04/06/15 0944 Last data filed at 04/06/15 0600  Gross per 24 hour  Intake     60 ml  Output   1050 ml  Net   -990 ml   Filed Weights   04/05/15 0513 04/06/15 0500  Weight: 76.658 kg (169 lb) 85.6 kg (188 lb 11.4 oz)    Exam:   General:  Alert, awake, oriented 3  Cardiovascular: Tachycardic  Respiratory: Decreased breath sounds, no wheezes  Abdomen: Soft, nontender, nondistended  Extremities: 1+ pitting edema  bilaterally   Neurologic:  Grossly intact and nonfocal  Data Reviewed: Basic Metabolic Panel:  Recent Labs Lab 04/05/15 0516 04/05/15 1030 04/06/15 0451  NA 143  --  141  K 4.0  --  3.9  CL 104  --  104  CO2 29  --  28  GLUCOSE 121*  --  178*  BUN 16  --  26*  CREATININE 0.86 0.80 0.81  CALCIUM 8.8*  --  8.7*   Liver Function Tests: No results for input(s): AST, ALT, ALKPHOS, BILITOT, PROT, ALBUMIN in the last 168 hours. No results for input(s): LIPASE, AMYLASE in the last 168 hours. No results for input(s): AMMONIA in the last 168 hours. CBC:  Recent Labs Lab 04/05/15 0516 04/05/15 1030 04/06/15 0451  WBC 9.0 6.9 4.8  NEUTROABS 6.5  --   --   HGB 14.8 14.1 13.4  HCT 44.6 43.3 41.2  MCV 96.3 96.4 96.0  PLT 193 183 188   Cardiac Enzymes: No results for input(s): CKTOTAL, CKMB, CKMBINDEX, TROPONINI in the last 168 hours. BNP (last 3 results) No results for input(s): BNP in the last 8760 hours.  ProBNP (last 3 results) No results for input(s): PROBNP in the last 8760 hours.  CBG: No results for input(s): GLUCAP in the last 168 hours.  Recent Results (from the past 240 hour(s))  Urine culture     Status: None (Preliminary result)   Collection Time: 04/05/15  5:35 AM  Result Value Ref Range Status   Specimen Description URINE, CATHETERIZED  Final   Special Requests NONE  Final   Culture   Final    NO GROWTH < 24 HOURS [facility]    Report Status PENDING  Incomplete  MRSA PCR Screening     Status: None   Collection Time: 04/05/15 10:00 AM  Result Value Ref Range Status   MRSA by PCR NEGATIVE NEGATIVE Final    Comment:        The GeneXpert MRSA Assay (FDA approved for NASAL specimens only), is one component of a comprehensive MRSA colonization surveillance program. It is not intended to diagnose MRSA infection nor to guide or monitor treatment for MRSA infections.   Culture, blood (routine x 2) Call MD if unable to obtain  prior to antibiotics being given     Status: None (Preliminary result)   Collection Time: 04/05/15 10:30 AM  Result Value Ref Range Status   Specimen Description BLOOD LEFT ANTECUBITAL  Final   Special Requests BOTTLES DRAWN AEROBIC AND ANAEROBIC 10CC EACH  Final   Culture NO GROWTH < 12 HOURS  Final   Report Status PENDING  Incomplete  Culture, blood (routine x 2) Call MD if unable to obtain prior to antibiotics being given     Status: None (Preliminary result)   Collection Time: 04/05/15 10:36 AM  Result Value Ref Range Status   Specimen Description BLOOD RIGHT HAND  Final   Special Requests BOTTLES DRAWN AEROBIC AND ANAEROBIC 10CC EACH  Final   Culture NO GROWTH < 12 HOURS  Final   Report Status PENDING  Incomplete     Studies: Dg Chest Portable 1 View  04/05/2015  CLINICAL DATA:  Acute onset of respiratory distress. Generalized weakness and chest pressure. Initial encounter. EXAM: PORTABLE CHEST 1 VIEW COMPARISON:  None. FINDINGS: The lungs are hyperexpanded, with flattening of the hemidiaphragms, raising concern for COPD. Mild bibasilar opacities may reflect mild interstitial edema or pneumonia. Mild vascular congestion is noted. There is no evidence of pleural effusion or pneumothorax. The cardiomediastinal silhouette is borderline normal in size. No acute osseous abnormalities are seen. IMPRESSION: 1. Mild bibasilar airspace opacities may reflect mild interstitial edema or pneumonia. Mild vascular congestion noted. 2. Findings of COPD. Electronically Signed   By: Roanna RaiderJeffery  Chang M.D.   On: 04/05/2015 05:55    Scheduled Meds: . aspirin EC  81 mg Oral Daily  . atorvastatin  10 mg Oral Daily  . azithromycin  500 mg Intravenous Q24H  . cefTRIAXone (ROCEPHIN)  IV  1 g Intravenous Q24H  . diltiazem  300 mg Oral Daily  . enoxaparin (LOVENOX) injection  40 mg Subcutaneous Q24H  . famotidine  10 mg Oral BID  . furosemide  20 mg Intravenous Daily  . ipratropium-albuterol  3 mL Nebulization  Q6H  . methylPREDNISolone (SOLU-MEDROL) injection  80 mg Intravenous Q6H  . multivitamin with minerals  1 tablet Oral Daily  . sodium chloride  3 mL Intravenous Q12H  . traZODone  100 mg Oral QHS   Continuous Infusions:   Principal Problem:   Acute on chronic respiratory failure (HCC) Active Problems:   COPD exacerbation (HCC)   CAP (community acquired pneumonia)    Time spent: 25 minutes. Greater than 50% of this time was spent in direct contact with the patient coordinating care.    Chaya JanHERNANDEZ ACOSTA,ESTELA  [facility] Pager [phone]  If 7PM-7AM, please contact night-coverage at [email or web address], password [email or web address] 04/06/2015, 9:44 AM  LOS: 1 day

## 2015-04-07 ENCOUNTER — Inpatient Hospital Stay (HOSPITAL_COMMUNITY): Payer: Medicare HMO

## 2015-04-07 LAB — BRAIN NATRIURETIC PEPTIDE: B NATRIURETIC PEPTIDE 5: 26 pg/mL (ref 0.0–100.0)

## 2015-04-07 LAB — CBC
HEMATOCRIT: 39.5 % (ref 36.0–46.0)
Hemoglobin: 13.1 g/dL (ref 12.0–15.0)
MCH: 31.9 pg (ref 26.0–34.0)
MCHC: 33.2 g/dL (ref 30.0–36.0)
MCV: 96.1 fL (ref 78.0–100.0)
Platelets: 201 10*3/uL (ref 150–400)
RBC: 4.11 MIL/uL (ref 3.87–5.11)
RDW: 13.3 % (ref 11.5–15.5)
WBC: 9.5 10*3/uL (ref 4.0–10.5)

## 2015-04-07 LAB — BASIC METABOLIC PANEL
Anion gap: 9 (ref 5–15)
BUN: 34 mg/dL — AB (ref 6–20)
CALCIUM: 8.7 mg/dL — AB (ref 8.9–10.3)
CO2: 31 mmol/L (ref 22–32)
Chloride: 102 mmol/L (ref 101–111)
Creatinine, Ser: 0.87 mg/dL (ref 0.44–1.00)
GFR calc Af Amer: >60 mL/min (ref >60)
GLUCOSE: 165 mg/dL — AB (ref 65–99)
Potassium: 4.2 mmol/L (ref 3.5–5.1)
Sodium: 142 mmol/L (ref 135–145)

## 2015-04-07 MED ORDER — POLYETHYLENE GLYCOL 3350 17 G PO PACK
17.0000 g | PACK | Freq: Every day | ORAL | Status: DC | PRN
  Administered 2015-04-11 – 2015-04-13 (×2): 17 g via ORAL
  Filled 2015-04-07 (×3): qty 1

## 2015-04-07 MED ORDER — PNEUMOCOCCAL VAC POLYVALENT 25 MCG/0.5ML IJ INJ
0.5000 mL | INJECTION | INTRAMUSCULAR | Status: AC
  Administered 2015-04-08: 0.5 mL via INTRAMUSCULAR

## 2015-04-07 MED ORDER — PREDNISONE 20 MG PO TABS
40.0000 mg | ORAL_TABLET | Freq: Every day | ORAL | Status: DC
  Administered 2015-04-08: 40 mg via ORAL
  Filled 2015-04-07 (×2): qty 2

## 2015-04-07 MED ORDER — FUROSEMIDE 20 MG PO TABS
20.0000 mg | ORAL_TABLET | Freq: Every day | ORAL | Status: DC
  Administered 2015-04-07 – 2015-04-08 (×2): 20 mg via ORAL
  Filled 2015-04-07 (×3): qty 1

## 2015-04-07 MED ORDER — AZITHROMYCIN 250 MG PO TABS
500.0000 mg | ORAL_TABLET | Freq: Every day | ORAL | Status: DC
  Administered 2015-04-07 – 2015-04-10 (×3): 500 mg via ORAL
  Filled 2015-04-07 (×5): qty 2

## 2015-04-07 NOTE — Progress Notes (Signed)
**Note De-Identified Ota Ebersole Obfuscation** RT called to check BIPAP mask and alarms; Patient not receiving set PIP.  Patient not in distress.  Pressure line not connected; situation corrected. Patient states that she is breathing easier.  RRT to continue to monitor.

## 2015-04-07 NOTE — Progress Notes (Signed)
**Note De-Identified Avory Mimbs Obfuscation** Patient removed from BIPAP and placed on 4 L [place]; tolerating moderately well.  RRT will continue to monitor.

## 2015-04-07 NOTE — Progress Notes (Signed)
Subjective: She says she feels better. She was able to be off BiPAP yesterday and has done well with that. She has no new complaints. She was able to eat some yesterday. She went back on BiPAP overnight.  Objective: Vital signs in last 24 hours: Temp:  [98.1 F (36.7 C)-98.6 F (37 C)] 98.1 F (36.7 C) (01/04 0400) Pulse Rate:  [63-129] 84 (01/04 0500) Resp:  [13-35] 23 (01/04 0500) BP: (100-125)/(63-78) 116/71 mmHg (01/04 0500) SpO2:  [91 %-99 %] 97 % (01/04 0500) FiO2 (%):  [40 %] 40 % (01/04 0211) Weight:  [85.4 kg (188 lb 4.4 oz)] 85.4 kg (188 lb 4.4 oz) (01/04 0500) Weight change: -0.2 kg (-7.1 oz) Last BM Date: 04/04/15  Intake/Output from previous day: 01/03 0701 - 01/04 0700 In: 923 [P.O.:720; I.V.:203] Out: 1000 [Urine:1000]  PHYSICAL EXAM General appearance: alert, cooperative, mild distress, morbidly obese and On BiPAP Resp: rhonchi bilaterally Cardio: regular rate and rhythm, S1, S2 normal, no murmur, click, rub or gallop GI: soft, non-tender; bowel sounds normal; no masses,  no organomegaly Extremities: extremities normal, atraumatic, no cyanosis or edema  Lab Results:  Results for orders placed or performed during the hospital encounter of 04/05/15 (from the past 48 hour(s))  MRSA PCR Screening     Status: None   Collection Time: 04/05/15 10:00 AM  Result Value Ref Range   MRSA by PCR NEGATIVE NEGATIVE    Comment:        The GeneXpert MRSA Assay (FDA approved for NASAL specimens only), is one component of a comprehensive MRSA colonization surveillance program. It is not intended to diagnose MRSA infection nor to guide or monitor treatment for MRSA infections.   Culture, blood (routine x 2) Call MD if unable to obtain prior to antibiotics being given     Status: None (Preliminary result)   Collection Time: 04/05/15 10:30 AM  Result Value Ref Range   Specimen Description BLOOD LEFT ANTECUBITAL    Special Requests BOTTLES DRAWN AEROBIC AND ANAEROBIC 10CC  EACH    Culture NO GROWTH 1 DAY    Report Status PENDING   HIV antibody     Status: None   Collection Time: 04/05/15 10:30 AM  Result Value Ref Range   HIV Screen 4th Generation wRfx Non Reactive Non Reactive    Comment: (NOTE) Performed At: [facility] [place], [place] [id number] Lindon Romp MD ZH:[id number]   CBC     Status: None   Collection Time: 04/05/15 10:30 AM  Result Value Ref Range   WBC 6.9 4.0 - 10.5 K/uL   RBC 4.49 3.87 - 5.11 MIL/uL   Hemoglobin 14.1 12.0 - 15.0 g/dL   HCT 43.3 36.0 - 46.0 %   MCV 96.4 78.0 - 100.0 fL   MCH 31.4 26.0 - 34.0 pg   MCHC 32.6 30.0 - 36.0 g/dL   RDW 13.4 11.5 - 15.5 %   Platelets 183 150 - 400 K/uL  Creatinine, serum     Status: None   Collection Time: 04/05/15 10:30 AM  Result Value Ref Range   Creatinine, Ser 0.80 0.44 - 1.00 mg/dL   GFR calc non Af Amer >60 >60 mL/min   GFR calc Af Amer >60 >60 mL/min    Comment: (NOTE) The eGFR has been calculated using the CKD EPI equation. This calculation has not been validated in all clinical situations. eGFR's persistently <60 mL/min signify possible Chronic Kidney Disease.   Culture, blood (routine x 2) Call MD  if unable to obtain prior to antibiotics being given     Status: None (Preliminary result)   Collection Time: 04/05/15 10:36 AM  Result Value Ref Range   Specimen Description BLOOD RIGHT HAND    Special Requests BOTTLES DRAWN AEROBIC AND ANAEROBIC 10CC EACH    Culture NO GROWTH 1 DAY    Report Status PENDING   Strep pneumoniae urinary antigen     Status: None   Collection Time: 04/05/15 12:00 PM  Result Value Ref Range   Strep Pneumo Urinary Antigen NEGATIVE NEGATIVE    Comment:        Infection due to S. pneumoniae cannot be absolutely ruled out since the antigen present may be below the detection limit of the test. Performed at [facility]   Influenza panel by pcr     Status: None   Collection Time: 04/05/15 12:00 PM  Result  Value Ref Range   Influenza A By PCR NEGATIVE NEGATIVE   Influenza B By PCR NEGATIVE NEGATIVE   H1N1 flu by pcr NOT DETECTED NOT DETECTED    Comment:        The Xpert Flu assay (FDA approved for nasal aspirates or washes and nasopharyngeal swab specimens), is intended as an aid in the diagnosis of influenza and should not be used as a sole basis for treatment.   Legionella antigen, urine (not at [facility])     Status: None   Collection Time: 04/05/15 12:00 PM  Result Value Ref Range   Specimen Description URINE, CATHETERIZED    Special Requests NONE    Legionella Antigen, Urine      Negative for Legionella pneumophila serogroup 1                                                              Legionella pneumophila serogroup 1 antigen can be detected in urine within 2 to 3 days of infection and may persist even after treatment. This  assay does not detect other Legionella species or serogroups. Performed at [facility]    Report Status 04/06/2015 FINAL   Basic metabolic panel     Status: Abnormal   Collection Time: 04/06/15  4:51 AM  Result Value Ref Range   Sodium 141 135 - 145 mmol/L   Potassium 3.9 3.5 - 5.1 mmol/L   Chloride 104 101 - 111 mmol/L   CO2 28 22 - 32 mmol/L   Glucose, Bld 178 (H) 65 - 99 mg/dL   BUN 26 (H) 6 - 20 mg/dL   Creatinine, Ser 0.81 0.44 - 1.00 mg/dL   Calcium 8.7 (L) 8.9 - 10.3 mg/dL   GFR calc non Af Amer >60 >60 mL/min   GFR calc Af Amer >60 >60 mL/min    Comment: (NOTE) The eGFR has been calculated using the CKD EPI equation. This calculation has not been validated in all clinical situations. eGFR's persistently <60 mL/min signify possible Chronic Kidney Disease.    Anion gap 9 5 - 15  CBC     Status: None   Collection Time: 04/06/15  4:51 AM  Result Value Ref Range   WBC 4.8 4.0 - 10.5 K/uL   RBC 4.29 3.87 - 5.11 MIL/uL   Hemoglobin 13.4 12.0 - 15.0 g/dL   HCT 41.2 36.0 - 46.0 %  MCV 96.0 78.0 - 100.0 fL   MCH 31.2 26.0 - 34.0 pg    MCHC 32.5 30.0 - 36.0 g/dL   RDW 13.2 11.5 - 15.5 %   Platelets 188 150 - 400 K/uL  Basic metabolic panel     Status: Abnormal   Collection Time: 04/07/15  5:12 AM  Result Value Ref Range   Sodium 142 135 - 145 mmol/L   Potassium 4.2 3.5 - 5.1 mmol/L   Chloride 102 101 - 111 mmol/L   CO2 31 22 - 32 mmol/L   Glucose, Bld 165 (H) 65 - 99 mg/dL   BUN 34 (H) 6 - 20 mg/dL   Creatinine, Ser 0.87 0.44 - 1.00 mg/dL   Calcium 8.7 (L) 8.9 - 10.3 mg/dL   GFR calc non Af Amer >60 >60 mL/min   GFR calc Af Amer >60 >60 mL/min    Comment: (NOTE) The eGFR has been calculated using the CKD EPI equation. This calculation has not been validated in all clinical situations. eGFR's persistently <60 mL/min signify possible Chronic Kidney Disease.    Anion gap 9 5 - 15  CBC     Status: None   Collection Time: 04/07/15  5:12 AM  Result Value Ref Range   WBC 9.5 4.0 - 10.5 K/uL   RBC 4.11 3.87 - 5.11 MIL/uL   Hemoglobin 13.1 12.0 - 15.0 g/dL   HCT 39.5 36.0 - 46.0 %   MCV 96.1 78.0 - 100.0 fL   MCH 31.9 26.0 - 34.0 pg   MCHC 33.2 30.0 - 36.0 g/dL   RDW 13.3 11.5 - 15.5 %   Platelets 201 150 - 400 K/uL    ABGS  Recent Labs  04/05/15 0530  PHART 7.327*  PO2ART 229*  TCO2 20.8  HCO3 24.8*   CULTURES Recent Results (from the past 240 hour(s))  Urine culture     Status: None   Collection Time: 04/05/15  5:35 AM  Result Value Ref Range Status   Specimen Description URINE, CATHETERIZED  Final   Special Requests NONE  Final   Culture   Final    NO GROWTH 1 DAY Performed at [facility]    Report Status 04/06/2015 FINAL  Final  MRSA PCR Screening     Status: None   Collection Time: 04/05/15 10:00 AM  Result Value Ref Range Status   MRSA by PCR NEGATIVE NEGATIVE Final    Comment:        The GeneXpert MRSA Assay (FDA approved for NASAL specimens only), is one component of a comprehensive MRSA colonization surveillance program. It is not intended to diagnose MRSA infection nor  to guide or monitor treatment for MRSA infections.   Culture, blood (routine x 2) Call MD if unable to obtain prior to antibiotics being given     Status: None (Preliminary result)   Collection Time: 04/05/15 10:30 AM  Result Value Ref Range Status   Specimen Description BLOOD LEFT ANTECUBITAL  Final   Special Requests BOTTLES DRAWN AEROBIC AND ANAEROBIC 10CC EACH  Final   Culture NO GROWTH 1 DAY  Final   Report Status PENDING  Incomplete  Culture, blood (routine x 2) Call MD if unable to obtain prior to antibiotics being given     Status: None (Preliminary result)   Collection Time: 04/05/15 10:36 AM  Result Value Ref Range Status   Specimen Description BLOOD RIGHT HAND  Final   Special Requests BOTTLES DRAWN AEROBIC AND ANAEROBIC [place]  Final  Culture NO GROWTH 1 DAY  Final   Report Status PENDING  Incomplete   Studies/Results: No results found.  Medications:  Prior to Admission:  Prescriptions prior to admission  Medication Sig Dispense Refill Last Dose  . albuterol (PROAIR HFA) 108 (90 BASE) MCG/ACT inhaler Inhale 2 puffs into the lungs every 6 (six) hours as needed for wheezing or shortness of breath.   Past Week at Unknown time  . aspirin EC 81 MG tablet Take 81 mg by mouth daily.   04/04/2015 at Unknown time  . atorvastatin (LIPITOR) 10 MG tablet Take 10 mg by mouth daily.   04/04/2015 at Unknown time  . calcium carbonate (TUMS - DOSED IN MG ELEMENTAL CALCIUM) 500 MG chewable tablet Chew 1 tablet by mouth as needed.   Past Week at Unknown time  . diltiazem (CARDIZEM CD) 300 MG 24 hr capsule Take 1 capsule (300 mg total) by mouth daily. 30 capsule 11 04/04/2015 at Unknown time  . famotidine (PEPCID) 10 MG tablet Take 10 mg by mouth 2 (two) times daily.   Past Week at Unknown time  . furosemide (LASIX) 20 MG tablet Take 1 tab daily as needed for swelling 90 tablet 3 Past Week at Unknown time  . ipratropium-albuterol (DUONEB) 0.5-2.5 (3) MG/3ML SOLN Take 3 mLs by nebulization 4  (four) times daily.    Past Week at Unknown time  . Multiple Vitamin (MULTIVITAMIN) tablet Take 1 tablet by mouth daily.   04/04/2015 at Unknown time  . traZODone (DESYREL) 100 MG tablet Take 100 mg by mouth at bedtime.   04/04/2015 at Unknown time   Scheduled: . aspirin EC  81 mg Oral Daily  . atorvastatin  10 mg Oral Daily  . azithromycin  500 mg Intravenous Q24H  . cefTRIAXone (ROCEPHIN)  IV  1 g Intravenous Q24H  . diltiazem  300 mg Oral Daily  . enoxaparin (LOVENOX) injection  40 mg Subcutaneous Q24H  . famotidine  10 mg Oral BID  . furosemide  20 mg Intravenous Daily  . ipratropium-albuterol  3 mL Nebulization Q6H  . methylPREDNISolone (SOLU-MEDROL) injection  80 mg Intravenous Q6H  . multivitamin with minerals  1 tablet Oral Daily  . pneumococcal 23 valent vaccine  0.5 mL Intramuscular Tomorrow-1000  . sodium chloride  3 mL Intravenous Q12H  . traZODone  100 mg Oral QHS   Continuous:  SWN:IOEVOJ chloride, acetaminophen **OR** acetaminophen, albuterol, ondansetron **OR** ondansetron (ZOFRAN) IV, senna-docusate, sodium chloride  Assesment: She was admitted with acute on chronic respiratory failure from COPD exacerbation and community-acquired pneumonia. She is improving. She is still requiring BiPAP at night. I think unless she has some clinical deterioration now she has largely passed the risk for intubation and mechanical ventilation. Chest x-ray to my reading is unchanged Principal Problem:   Acute on chronic respiratory failure (HCC) Active Problems:   COPD exacerbation (HCC)   CAP (community acquired pneumonia)    Plan: Continue current treatments including IV antibiotics and steroids. Continue trying to wean from BiPAP.    LOS: 2 days   Adelai Achey L 04/07/2015, 7:24 AM

## 2015-04-07 NOTE — Plan of Care (Signed)
Problem: Phase I Progression Outcomes Goal: O2 sats > or equal 90% or at baseline Outcome: Progressing Patient going back and forth between bipap and [place] Goal: Dyspnea controlled at rest Outcome: Progressing Patient becomes very anxious and wants her bipap mask off then has a coughing spell and become dyspneic demanding to have her bipap back on  Goal: Hemodynamically stable Outcome: Progressing Vital signs are currently stable Goal: Flu/PneumoVaccines if indicated Outcome: Progressing Ordered to be given 04/08/2015 Goal: Pain controlled Outcome: Completed/Met Date Met:  04/07/15 Denies any pain at this time Goal: Progress activity as tolerated unless otherwise ordered Outcome: Not Progressing Moving in bed increased her work of breathing and SOB Goal: Discharge plan established Outcome: Completed/Met Date Met:  04/07/15 home Goal: Tolerating diet Outcome: Completed/Met Date Met:  04/07/15 Tolerating food with adequate intake; complains about the ordered diet and food brought

## 2015-04-07 NOTE — Progress Notes (Signed)
Physician Signed Pulmonology Consult Note 04/06/2015 7:42 AM    Expand All Collapse All   Subjective: She feels better. She has no new complaints. Her breathing is doing better. She still on BiPAP. She says she's having trouble with her legs being crampy  Objective: Vital signs in last 24 hours: Temp: [97.2 F (36.2 C)-97.7 F (36.5 C)] 97.6 F (36.4 C) (01/03 0400) Pulse Rate: [67-112] 83 (01/03 0500) Resp: [17-36] 22 (01/03 0500) BP: (94-119)/(65-82) 114/69 mmHg (01/03 0500) SpO2: [95 %-100 %] 98 % (01/03 0500) FiO2 (%): [40 %-50 %] 40 % (01/03 0212) Weight: [85.6 kg (188 lb 11.4 oz)] 85.6 kg (188 lb 11.4 oz) (01/03 0500) Weight change: 8.942 kg (19 lb 11.4 oz)    Intake/Output from previous day: 01/02 0701 - 01/03 0700 In: 60 [I.V.:60] Out: 1050 [Urine:1050]  PHYSICAL EXAM General appearance: alert, cooperative, mild distress and On BiPAP and her respiratory rate is still around 18 Resp: She is moving air much better. She has end expiratory wheezes and prolonged expiratory phase Cardio: regular rate and rhythm, S1, S2 normal, no murmur, click, rub or gallop GI: soft, non-tender; bowel sounds normal; no masses, no organomegaly Extremities: extremities normal, atraumatic, no cyanosis or edema  Lab Results:   Lab Results Last 48 Hours    Results for orders placed or performed during the hospital encounter of 04/05/15 (from the past 48 hour(s))  Basic metabolic panel Status: Abnormal   Collection Time: 04/05/15 5:16 AM  Result Value Ref Range   Sodium 143 135 - 145 mmol/L   Potassium 4.0 3.5 - 5.1 mmol/L   Chloride 104 101 - 111 mmol/L   CO2 29 22 - 32 mmol/L   Glucose, Bld 121 (H) 65 - 99 mg/dL   BUN 16 6 - 20 mg/dL   Creatinine, Ser 0.86 0.44 - 1.00 mg/dL   Calcium 8.8 (L) 8.9 - 10.3 mg/dL   GFR calc non Af Amer >60 >60 mL/min   GFR calc Af Amer >60 >60 mL/min    Comment: (NOTE) The eGFR has been  calculated using the CKD EPI equation. This calculation has not been validated in all clinical situations. eGFR's persistently <60 mL/min signify possible Chronic Kidney Disease.    Anion gap 10 5 - 15  CBC with Differential/Platelet Status: None   Collection Time: 04/05/15 5:16 AM  Result Value Ref Range   WBC 9.0 4.0 - 10.5 K/uL   RBC 4.63 3.87 - 5.11 MIL/uL   Hemoglobin 14.8 12.0 - 15.0 g/dL   HCT 44.6 36.0 - 46.0 %   MCV 96.3 78.0 - 100.0 fL   MCH 32.0 26.0 - 34.0 pg   MCHC 33.2 30.0 - 36.0 g/dL   RDW 13.4 11.5 - 15.5 %   Platelets 193 150 - 400 K/uL   Neutrophils Relative % 73 %   Neutro Abs 6.5 1.7 - 7.7 K/uL   Lymphocytes Relative 17 %   Lymphs Abs 1.6 0.7 - 4.0 K/uL   Monocytes Relative 9 %   Monocytes Absolute 0.8 0.1 - 1.0 K/uL   Eosinophils Relative 1 %   Eosinophils Absolute 0.1 0.0 - 0.7 K/uL   Basophils Relative 0 %   Basophils Absolute 0.0 0.0 - 0.1 K/uL  Blood gas, arterial Status: Abnormal   Collection Time: 04/05/15 5:30 AM  Result Value Ref Range   FIO2 0.50    Delivery systems BILEVEL POSITIVE AIRWAY PRESSURE    Inspiratory PAP 16.0    Expiratory PAP 8  pH, Arterial 7.327 (L) 7.350 - 7.450   pCO2 arterial 52.8 (H) 35.0 - 45.0 mmHg   pO2, Arterial 229 (H) 80.0 - 100.0 mmHg   Bicarbonate 24.8 (H) 20.0 - 24.0 mEq/L   TCO2 20.8 0 - 100 mmol/L   Acid-Base Excess 1.5 0.0 - 2.0 mmol/L   O2 Saturation 99.1 %   Collection site LEFT RADIAL    Drawn by [id number]    Sample type ARTERIAL DRAW    Allens test (pass/fail) PASS PASS  Urinalysis, Routine w reflex microscopic (not at [facility]) Status: Abnormal   Collection Time: 04/05/15 6:00 AM  Result Value Ref Range   Color, Urine YELLOW YELLOW   APPearance CLEAR CLEAR   Specific Gravity, Urine >1.030 (H) 1.005 - 1.030   pH 5.5  5.0 - 8.0   Glucose, UA NEGATIVE NEGATIVE mg/dL   Hgb urine dipstick TRACE (A) NEGATIVE   Bilirubin Urine SMALL (A) NEGATIVE   Ketones, ur NEGATIVE NEGATIVE mg/dL   Protein, ur 100 (A) NEGATIVE mg/dL   Nitrite NEGATIVE NEGATIVE   Leukocytes, UA NEGATIVE NEGATIVE  Urine microscopic-add on Status: Abnormal   Collection Time: 04/05/15 6:00 AM  Result Value Ref Range   Squamous Epithelial / LPF NONE SEEN NONE SEEN   WBC, UA NONE SEEN 0 - 5 WBC/hpf   RBC / HPF 0-5 0 - 5 RBC/hpf   Bacteria, UA MANY (A) NONE SEEN  MRSA PCR Screening Status: None   Collection Time: 04/05/15 10:00 AM  Result Value Ref Range   MRSA by PCR NEGATIVE NEGATIVE    Comment:   The GeneXpert MRSA Assay (FDA approved for NASAL specimens only), is one component of a comprehensive MRSA colonization surveillance program. It is not intended to diagnose MRSA infection nor to guide or monitor treatment for MRSA infections.   Culture, blood (routine x 2) Call MD if unable to obtain prior to antibiotics being given Status: None (Preliminary result)   Collection Time: 04/05/15 10:30 AM  Result Value Ref Range   Specimen Description BLOOD LEFT ANTECUBITAL    Special Requests BOTTLES DRAWN AEROBIC AND ANAEROBIC 10CC EACH    Culture NO GROWTH < 12 HOURS    Report Status PENDING   HIV antibody Status: None   Collection Time: 04/05/15 10:30 AM  Result Value Ref Range   HIV Screen 4th Generation wRfx Non Reactive Non Reactive    Comment: (NOTE) Performed At: [facility] [place], [place] [id number] Lindon Romp MD QP:[id number]   CBC Status: None   Collection Time: 04/05/15 10:30 AM  Result Value Ref Range   WBC 6.9 4.0 - 10.5 K/uL   RBC 4.49 3.87 - 5.11 MIL/uL   Hemoglobin 14.1 12.0 - 15.0 g/dL   HCT 43.3 36.0 - 46.0 %   MCV 96.4 78.0 -  100.0 fL   MCH 31.4 26.0 - 34.0 pg   MCHC 32.6 30.0 - 36.0 g/dL   RDW 13.4 11.5 - 15.5 %   Platelets 183 150 - 400 K/uL  Creatinine, serum Status: None   Collection Time: 04/05/15 10:30 AM  Result Value Ref Range   Creatinine, Ser 0.80 0.44 - 1.00 mg/dL   GFR calc non Af Amer >60 >60 mL/min   GFR calc Af Amer >60 >60 mL/min    Comment: (NOTE) The eGFR has been calculated using the CKD EPI equation. This calculation has not been validated in all clinical situations. eGFR's persistently <60 mL/min signify possible Chronic Kidney Disease.   Culture,  blood (routine x 2) Call MD if unable to obtain prior to antibiotics being given Status: None (Preliminary result)   Collection Time: 04/05/15 10:36 AM  Result Value Ref Range   Specimen Description BLOOD RIGHT HAND    Special Requests BOTTLES DRAWN AEROBIC AND ANAEROBIC 10CC EACH    Culture NO GROWTH < 12 HOURS    Report Status PENDING   Strep pneumoniae urinary antigen Status: None   Collection Time: 04/05/15 12:00 PM  Result Value Ref Range   Strep Pneumo Urinary Antigen NEGATIVE NEGATIVE    Comment:   Infection due to S. pneumoniae cannot be absolutely ruled out since the antigen present may be below the detection limit of the test. Performed at [facility]   Influenza panel by pcr Status: None   Collection Time: 04/05/15 12:00 PM  Result Value Ref Range   Influenza A By PCR NEGATIVE NEGATIVE   Influenza B By PCR NEGATIVE NEGATIVE   H1N1 flu by pcr NOT DETECTED NOT DETECTED    Comment:   The Xpert Flu assay (FDA approved for nasal aspirates or washes and nasopharyngeal swab specimens), is intended as an aid in the diagnosis of influenza and should not be used as a sole basis for treatment.   Basic metabolic panel Status: Abnormal   Collection Time: 04/06/15 4:51 AM  Result Value  Ref Range   Sodium 141 135 - 145 mmol/L   Potassium 3.9 3.5 - 5.1 mmol/L   Chloride 104 101 - 111 mmol/L   CO2 28 22 - 32 mmol/L   Glucose, Bld 178 (H) 65 - 99 mg/dL   BUN 26 (H) 6 - 20 mg/dL   Creatinine, Ser 0.81 0.44 - 1.00 mg/dL   Calcium 8.7 (L) 8.9 - 10.3 mg/dL   GFR calc non Af Amer >60 >60 mL/min   GFR calc Af Amer >60 >60 mL/min    Comment: (NOTE) The eGFR has been calculated using the CKD EPI equation. This calculation has not been validated in all clinical situations. eGFR's persistently <60 mL/min signify possible Chronic Kidney Disease.    Anion gap 9 5 - 15  CBC Status: None   Collection Time: 04/06/15 4:51 AM  Result Value Ref Range   WBC 4.8 4.0 - 10.5 K/uL   RBC 4.29 3.87 - 5.11 MIL/uL   Hemoglobin 13.4 12.0 - 15.0 g/dL   HCT 41.2 36.0 - 46.0 %   MCV 96.0 78.0 - 100.0 fL   MCH 31.2 26.0 - 34.0 pg   MCHC 32.5 30.0 - 36.0 g/dL   RDW 13.2 11.5 - 15.5 %   Platelets 188 150 - 400 K/uL      ABGS  Recent Labs (last 2 labs)      Recent Labs  04/05/15 0530  PHART 7.327*  PO2ART 229*  TCO2 20.8  HCO3 24.8*     CULTURES Recent Results (from the past 240 hour(s))  MRSA PCR Screening Status: None   Collection Time: 04/05/15 10:00 AM  Result Value Ref Range Status   MRSA by PCR NEGATIVE NEGATIVE Final    Comment:   The GeneXpert MRSA Assay (FDA approved for NASAL specimens only), is one component of a comprehensive MRSA colonization surveillance program. It is not intended to diagnose MRSA infection nor to guide or monitor treatment for MRSA infections.   Culture, blood (routine x 2) Call MD if unable to obtain prior to antibiotics being given Status: None (Preliminary result)   Collection Time: 04/05/15 10:30 AM  Result Value Ref Range Status   Specimen Description BLOOD LEFT ANTECUBITAL  Final    Special Requests BOTTLES DRAWN AEROBIC AND ANAEROBIC 10CC EACH  Final   Culture NO GROWTH < 12 HOURS  Final   Report Status PENDING  Incomplete  Culture, blood (routine x 2) Call MD if unable to obtain prior to antibiotics being given Status: None (Preliminary result)   Collection Time: 04/05/15 10:36 AM  Result Value Ref Range Status   Specimen Description BLOOD RIGHT HAND  Final   Special Requests BOTTLES DRAWN AEROBIC AND ANAEROBIC 10CC EACH  Final   Culture NO GROWTH < 12 HOURS  Final   Report Status PENDING  Incomplete   Studies/Results:  Imaging Results (Last 48 hours)    Dg Chest Portable 1 View  04/05/2015 CLINICAL DATA: Acute onset of respiratory distress. Generalized weakness and chest pressure. Initial encounter. EXAM: PORTABLE CHEST 1 VIEW COMPARISON: None. FINDINGS: The lungs are hyperexpanded, with flattening of the hemidiaphragms, raising concern for COPD. Mild bibasilar opacities may reflect mild interstitial edema or pneumonia. Mild vascular congestion is noted. There is no evidence of pleural effusion or pneumothorax. The cardiomediastinal silhouette is borderline normal in size. No acute osseous abnormalities are seen. IMPRESSION: 1. Mild bibasilar airspace opacities may reflect mild interstitial edema or pneumonia. Mild vascular congestion noted. 2. Findings of COPD. Electronically Signed By: Garald Balding M.D. On: 04/05/2015 05:55     Medications:  Prior to Admission:  Prescriptions prior to admission  Medication Sig Dispense Refill Last Dose  . albuterol (PROAIR HFA) 108 (90 BASE) MCG/ACT inhaler Inhale 2 puffs into the lungs every 6 (six) hours as needed for wheezing or shortness of breath.   Past Week at Unknown time  . aspirin EC 81 MG tablet Take 81 mg by mouth daily.   04/04/2015 at Unknown time  . atorvastatin (LIPITOR) 10 MG tablet Take 10 mg by mouth daily.   04/04/2015 at Unknown time  .  calcium carbonate (TUMS - DOSED IN MG ELEMENTAL CALCIUM) 500 MG chewable tablet Chew 1 tablet by mouth as needed.   Past Week at Unknown time  . diltiazem (CARDIZEM CD) 300 MG 24 hr capsule Take 1 capsule (300 mg total) by mouth daily. 30 capsule 11 04/04/2015 at Unknown time  . famotidine (PEPCID) 10 MG tablet Take 10 mg by mouth 2 (two) times daily.   Past Week at Unknown time  . furosemide (LASIX) 20 MG tablet Take 1 tab daily as needed for swelling 90 tablet 3 Past Week at Unknown time  . ipratropium-albuterol (DUONEB) 0.5-2.5 (3) MG/3ML SOLN Take 3 mLs by nebulization 4 (four) times daily.    Past Week at Unknown time  . Multiple Vitamin (MULTIVITAMIN) tablet Take 1 tablet by mouth daily.   04/04/2015 at Unknown time  . traZODone (DESYREL) 100 MG tablet Take 100 mg by mouth at bedtime.   04/04/2015 at Unknown time   Scheduled: . aspirin EC 81 mg Oral Daily  . atorvastatin 10 mg Oral Daily  . azithromycin 500 mg Intravenous Q24H  . cefTRIAXone (ROCEPHIN) IV 1 g Intravenous Q24H  . diltiazem 300 mg Oral Daily  . enoxaparin (LOVENOX) injection 40 mg Subcutaneous Q24H  . famotidine 10 mg Oral BID  . furosemide 20 mg Intravenous Daily  . ipratropium-albuterol 3 mL Nebulization Q6H  . methylPREDNISolone (SOLU-MEDROL) injection 80 mg Intravenous Q6H  . multivitamin with minerals 1 tablet Oral Daily  . sodium chloride 3 mL Intravenous Q12H  . traZODone 100 mg  Oral QHS   Continuous:  BWN:JNGWLT chloride, acetaminophen **OR** acetaminophen, albuterol, ondansetron **OR** ondansetron (ZOFRAN) IV, senna-docusate, sodium chloride  Assesment: She has what appears to be community-acquired pneumonia COPD exacerbation and acute on chronic respiratory failure. She is currently still requiring BiPAP. She is on appropriate treatment for COPD and pneumonia. She is still having respiratory  failure requiring BiPAP treatment. Her risk of needing intubation and mechanical ventilation is decreased from yesterday but still high. She did not have influenza or pneumococcus based on testing. She is still critically ill. Principal Problem:  Acute on chronic respiratory failure (HCC) Active Problems:  COPD exacerbation (HCC)  CAP (community acquired pneumonia)    Plan: Continue current treatments including IV steroids IV antibiotics and inhaled bronchodilators. See if she can come off BiPAP at least temporarily. I think she will probably require it at night at least for now..    LOS: 1 day   Margaret Stokes L 04/06/2015, 7:42 AM        Routing History     Date/Time From To Method   04/06/2015 7:50 AM Sinda Du, MD Sinda Du, MD Fax   04/06/2015 7:50 AM Sinda Du, MD Lenis Noon, PA-C Fax

## 2015-04-07 NOTE — Progress Notes (Signed)
[facility] PROGRESS NOTE  Margaret Stokes IRC:[id number] DOB: [birth year] DOA: 04/05/2015 PCP: Briscoe Burns, PA-C  64 ? 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 Edema? Vascular congestion + COPD findings Pulmonology consult  Subjective  Doing better  Off of Bipap overnight and tolerating the same Felt ill ~ 3 days No sputum Using inhalers regularily BASLEINE NEEDS TO USE A WALKER hAS BEEN sob    Assessment/Plan: Acute on chronic respiratory failure -Multifactorial secondary to COPD with acute exacerbation, community-acquired pneumonia and diastolic CHF. -BNP performed 1/4 was 26 and therefore Will not treat as CHF -Although her risk for intubation has decreased it still remains high.  -received BiPAP for 48 hours and has wean as tolerated 2 room air -baseline functional state is diminished -Continuealbuterol 2.5 every 2 when necessary, urinary in addition 3 meals every 6 scheduled -Transition Solu-Medrol to prednisone 40 on 04/07/15  COPD with acute exacerbation -Improved air movement, no wheezes. -Continue steroids at current dose, nebs, antibiotics.  Community-acquired pneumonia -All culture data remains negative to date. -wean off Rocephin 2azithromycin monotherapy at present time -There was some concern of possible bacteriuria however her urine cultures negative so no need for Rocephin coverage  Acute on chronic diastolic CHF -Echo in November 2015 shows normal ejection fraction with grade 1 diastolic dysfunction. 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 Monitor on telemetry currently on Cardizem300 daily which controls her  acute kidney injury Probably from Lasix use, transitioned to by mouth  hyperlipidemia Continue atorvastatin 10 daily  clamp Foley  Code Status: Full code Family Communication: Patient only  Disposition Plan: Keep in ICU given tenuous respiratory status   Consultants:  Pulmonary   Antibiotics:  Rocephin  Azithromycin   Subjective:  doing fair tolerated being off of BiPAP since this morning although still very winded At baseline patient moves around and feels very short of breath and sometimes can take 3-4 hours to come back from the grocery store and needs a lot of assistance no sputum No chills No fever Lower extremities feel smaller than usual No chest pain    Objective: Filed Vitals:   04/07/15 0400 04/07/15 0500 04/07/15 0749 04/07/15 0752  BP: 105/72 116/71  110/77  Pulse: 74 84  67  Temp: 98.1 F (36.7 C)  96.4 F (35.8 C)   TempSrc: Axillary  Axillary   Resp: 30 23  25   Height:      Weight:  85.4 kg (188 lb 4.4 oz)    SpO2: 95% 97%  95%    Intake/Output Summary (Last 24 hours) at 04/07/15 0759 Last data filed at 04/07/15 0500  Gross per 24 hour  Intake    923 ml  Output   1000 ml  Net    -77 ml   Filed Weights   04/05/15 0513 04/06/15 0500 04/07/15 0500  Weight: 76.658 kg (169 lb) 85.6 kg (188 lb 11.4 oz) 85.4 kg (188 lb 4.4 oz)    Exam:   General:  Alert, awake, oriented 3  Cardiovascular: Tachycardic  Respiratory: Decreased breath sounds, no wheezes  Abdomen: Soft, nontender, nondistended  Extremities: 1+ pitting edema bilaterally  Neurologic:  Grossly intact and nonfocal  Data Reviewed: Basic Metabolic Panel:  Recent Labs Lab 04/05/15 0516 04/05/15 1030 04/06/15 0451 04/07/15 0512  NA 143  --  141 142  K 4.0  --  3.9 4.2  CL 104  --  104 102  CO2 29  --  28 31  GLUCOSE 121*  --  178* 165*  BUN 16  --  26* 34*  CREATININE  0.86 0.80 0.81 0.87  CALCIUM 8.8*  --  8.7* 8.7*   Liver Function Tests: No results for input(s): AST, ALT, ALKPHOS, BILITOT, PROT, ALBUMIN in the last 168 hours. No results for input(s): LIPASE, AMYLASE in the last 168 hours. No results for input(s): AMMONIA in the last 168 hours. CBC:  Recent Labs Lab 04/05/15 0516 04/05/15 1030 04/06/15 0451 04/07/15 0512  WBC 9.0 6.9 4.8 9.5  NEUTROABS 6.5  --   --   --   HGB 14.8 14.1 13.4 13.1  HCT 44.6 43.3 41.2 39.5  MCV 96.3 96.4 96.0 96.1  PLT 193 183 188 201   Cardiac Enzymes: No results for input(s): CKTOTAL, CKMB, CKMBINDEX, TROPONINI in the last 168 hours. BNP (last 3 results) No results for input(s): BNP in the last 8760 hours.  ProBNP (last 3 results) No results for input(s): PROBNP in the last 8760 hours.  CBG: No results for input(s): GLUCAP in the last 168 hours.  Recent Results (from the past 240 hour(s))  Urine culture     Status: None   Collection Time: 04/05/15  5:35 AM  Result Value Ref Range Status   Specimen Description URINE, CATHETERIZED  Final   Special Requests NONE  Final   Culture   Final    NO GROWTH 1 DAY [facility]    Report Status 04/06/2015 FINAL  Final  MRSA PCR Screening     Status: None   Collection Time: 04/05/15 10:00 AM  Result Value Ref Range Status   MRSA by PCR NEGATIVE NEGATIVE Final    Comment:        The GeneXpert MRSA Assay (FDA approved for NASAL specimens only), is one component of a comprehensive MRSA colonization surveillance program. It is not intended to diagnose MRSA infection nor to guide or monitor treatment for MRSA infections.   Culture, blood (routine x 2) Call MD if unable to obtain prior to antibiotics being given     Status: None (Preliminary result)   Collection Time: 04/05/15 10:30 AM  Result Value Ref Range Status   Specimen Description BLOOD LEFT ANTECUBITAL  Final   Special Requests BOTTLES DRAWN AEROBIC AND ANAEROBIC 10CC EACH   Final   Culture NO GROWTH 1 DAY  Final   Report Status PENDING  Incomplete  Culture, blood (routine x 2) Call MD if unable to obtain prior to antibiotics being given     Status: None (Preliminary result)   Collection Time: 04/05/15 10:36 AM  Result Value Ref Range Status   Specimen Description BLOOD RIGHT HAND  Final   Special Requests BOTTLES DRAWN AEROBIC AND ANAEROBIC 10CC EACH  Final   Culture NO GROWTH 1 DAY  Final   Report Status PENDING  Incomplete     Studies: No results found.  Scheduled Meds: . aspirin EC  81 mg Oral Daily  . atorvastatin  10 mg Oral Daily  . azithromycin  500 mg Intravenous Q24H  . cefTRIAXone (ROCEPHIN)  IV  1 g Intravenous Q24H  . diltiazem  300 mg Oral  Daily  . enoxaparin (LOVENOX) injection  40 mg Subcutaneous Q24H  . famotidine  10 mg Oral BID  . furosemide  20 mg Intravenous Daily  . ipratropium-albuterol  3 mL Nebulization Q6H  . methylPREDNISolone (SOLU-MEDROL) injection  80 mg Intravenous Q6H  . multivitamin with minerals  1 tablet Oral Daily  . pneumococcal 23 valent vaccine  0.5 mL Intramuscular Tomorrow-1000  . sodium chloride  3 mL Intravenous Q12H  . traZODone  100 mg Oral QHS   Continuous Infusions:   Principal Problem:   Acute on chronic respiratory failure (HCC) Active Problems:   COPD exacerbation (HCC)   CAP (community acquired pneumonia)    Time spent: 25 minutes. Greater than 50% of this time was spent in direct contact with the patient coordinating care.    Pleas Koch, MD [facility] [phone]

## 2015-04-07 NOTE — Progress Notes (Signed)
Margaret Stokes was to have 2 view x-ray but became very agitated when taken off bipap. Will try again this afternoon.

## 2015-04-07 NOTE — Progress Notes (Signed)
**Note De-Identified Margaret Stokes Obfuscation** RT notified of patient request to be placed on BIPAP.  Patient states that her WOB is better and wants to wait on BIPAP. VS WNL.  RRT to continue to monitor.

## 2015-04-08 DIAGNOSIS — J9602 Acute respiratory failure with hypercapnia: Secondary | ICD-10-CM

## 2015-04-08 DIAGNOSIS — J440 Chronic obstructive pulmonary disease with acute lower respiratory infection: Secondary | ICD-10-CM | POA: Diagnosis not present

## 2015-04-08 DIAGNOSIS — J9601 Acute respiratory failure with hypoxia: Secondary | ICD-10-CM

## 2015-04-08 LAB — CBC WITH DIFFERENTIAL/PLATELET
Basophils Absolute: 0 10*3/uL (ref 0.0–0.1)
Basophils Relative: 0 %
EOS ABS: 0 10*3/uL (ref 0.0–0.7)
EOS PCT: 0 %
HCT: 40 % (ref 36.0–46.0)
Hemoglobin: 13 g/dL (ref 12.0–15.0)
LYMPHS ABS: 1.2 10*3/uL (ref 0.7–4.0)
Lymphocytes Relative: 12 %
MCH: 31.4 pg (ref 26.0–34.0)
MCHC: 32.5 g/dL (ref 30.0–36.0)
MCV: 96.6 fL (ref 78.0–100.0)
MONOS PCT: 8 %
Monocytes Absolute: 0.8 10*3/uL (ref 0.1–1.0)
Neutro Abs: 8 10*3/uL — ABNORMAL HIGH (ref 1.7–7.7)
Neutrophils Relative %: 80 %
PLATELETS: 198 10*3/uL (ref 150–400)
RBC: 4.14 MIL/uL (ref 3.87–5.11)
RDW: 13.4 % (ref 11.5–15.5)
WBC: 10.2 10*3/uL (ref 4.0–10.5)

## 2015-04-08 LAB — BLOOD GAS, ARTERIAL
Acid-Base Excess: 6.5 mmol/L — ABNORMAL HIGH (ref 0.0–2.0)
Bicarbonate: 29.5 mEq/L — ABNORMAL HIGH (ref 20.0–24.0)
DELIVERY SYSTEMS: POSITIVE
Drawn by: 21310
Expiratory PAP: 8
FIO2: 40
INSPIRATORY PAP: 16
O2 Saturation: 95.3 %
PCO2 ART: 51.2 mmHg — AB (ref 35.0–45.0)
PH ART: 7.403 (ref 7.350–7.450)
TCO2: 18.1 mmol/L (ref 0–100)
pO2, Arterial: 82.1 mmHg (ref 80.0–100.0)

## 2015-04-08 LAB — COMPREHENSIVE METABOLIC PANEL
ALT: 28 U/L (ref 14–54)
AST: 25 U/L (ref 15–41)
Albumin: 3.3 g/dL — ABNORMAL LOW (ref 3.5–5.0)
Alkaline Phosphatase: 65 U/L (ref 38–126)
Anion gap: 8 (ref 5–15)
BUN: 29 mg/dL — AB (ref 6–20)
CALCIUM: 8.6 mg/dL — AB (ref 8.9–10.3)
CO2: 31 mmol/L (ref 22–32)
CREATININE: 0.71 mg/dL (ref 0.44–1.00)
Chloride: 105 mmol/L (ref 101–111)
GFR calc Af Amer: >60 mL/min (ref >60)
Glucose, Bld: 127 mg/dL — ABNORMAL HIGH (ref 65–99)
Potassium: 4.1 mmol/L (ref 3.5–5.1)
Sodium: 144 mmol/L (ref 135–145)
TOTAL PROTEIN: 5.9 g/dL — AB (ref 6.5–8.1)
Total Bilirubin: 0.4 mg/dL (ref 0.3–1.2)

## 2015-04-08 MED ORDER — MORPHINE SULFATE (CONCENTRATE) 10 MG/0.5ML PO SOLN
5.0000 mg | ORAL | Status: DC | PRN
  Administered 2015-04-08 – 2015-04-14 (×3): 5 mg via ORAL
  Filled 2015-04-08 (×3): qty 0.5

## 2015-04-08 NOTE — Progress Notes (Signed)
**Note De-Identified Margaret Stokes Obfuscation** Patient requested to be placed on venturi mask due to her inability to breathe threw nose.

## 2015-04-08 NOTE — Progress Notes (Signed)
Called by RN to clarify with patient re Code status, as she is FULL CODE, but EDP wrote otherwise. I spoke to her with ICU RN.  She meant she didn't want it that night, but she confirmed if she requires intubation, she would like to get it.  Therefore,  She is a FULL CODE.   Houston SirenPeter Alijah Akram, MD  FACP.

## 2015-04-08 NOTE — Progress Notes (Signed)
[facility] PROGRESS NOTE  Margaret Stokes [id number] DOB: 10/10/1950 DOA: 04/05/2015 PCP: Briscoe BurnsSLATE,SCOTT, PA-C  64 ? 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 Edema? Vascular congestion + COPD findings Pulmonology consult  Subjective  Fair On and off bipap Still wheezy No sputum Using inhalers regularily Does not use home oxygen Baseline needs walker but is very SOB with minimal ADL's right over left    Assessment/Plan:  Acute on chronic respiratory failure -Multifactorial secondary to COPD with acute exacerbation, community-acquired pneumonia and diastolic CHF. -BNP performed 1/4 was 26 and therefore Will not treat as CHF -Although her risk for intubation has decreased it still remains high--Using Bipap prn when anixou -added low dose roxanol 0.25 for dyspneair -baseline functional state is diminished -Continue albuterol 2.5 every 2 when necessary, urinary in addition 3 meals every 6 scheduled -Transition Solu-Medrol to prednisone 40 on 04/07/15  COPD with acute exacerbation -Mild wheezes. -Continue steroids at current dose, nebs, antibiotics aithro daily till 04/10/15  Acute superimposed on Copd exacerbation -hasn't been febrile so unlikely PNA -All culture data remains negative to date. -wean off Rocephin 2 azithromycin monotherapy at present time -There was some concern of possible bacteriuria however her urine cultures negative so no need for Rocephin coverage  Acute on chronic diastolic CHF -Echo in November 2015 shows normal ejection fraction with grade 1 diastolic dysfunction. 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 Monitor on telemetry currently on Cardizem 300 daily which controls her HR Re-consider AC per Cardiology as OP  acute kidney injury Probably from Lasix use, transitioned to by mouth 04/07/15  hyperlipidemia Continue atorvastatin 10 daily  clamp Foley and discontinue  Code Status: Full code Family Communication: Patient only  Disposition Plan: Keep in ICU given tenuous respiratory status    Consultants:  Pulmonary   Antibiotics:  Rocephin  Azithromycin   Subjective:  doing fair tolerated being off of BiPAP since this morning although still very winded At baseline patient moves around and feels very short of breath and sometimes can take 3-4 hours to come back from the grocery store and needs a lot of assistance no sputum No chills No fever Lower extremities feel smaller than usual No chest pain    Objective: Filed Vitals:   04/08/15 0300 04/08/15 0400 04/08/15 0500 04/08/15 0600  BP: 113/74 102/66 125/76 109/79  Pulse: 66 65 82 65  Temp:  97.3 F (36.3 C)    TempSrc:  Axillary    Resp: 23 18 30 25   Height:   5\' 9"  (1.753 m)   Weight:   86.5 kg (190 lb 11.2 oz)   SpO2: 97% 98% 97% 100%    Intake/Output Summary (Last 24 hours) at 04/08/15 0728 Last data filed at 04/07/15 2207  Gross per 24 hour  Intake    843 ml  Output    900 ml  Net    -57 ml   Filed Weights   04/06/15 0500 04/07/15 0500 04/08/15 0500  Weight: 85.6 kg (188 lb 11.4 oz) 85.4 kg (188 lb 4.4 oz) 86.5 kg (190 lb 11.2 oz)    Exam:   General:  Alert, awake, oriented 3  Cardiovascular: Tachycardic  Respiratory: Decreased breath sounds, no wheezes  Abdomen: Soft, nontender, nondistended  Extremities: 1+ pitting edema bilaterally   Neurologic:  Grossly intact and nonfocal  Data Reviewed: Basic Metabolic Panel:  Recent Labs Lab 04/05/15 0516 04/05/15 1030 04/06/15 0451 04/07/15 0512  04/08/15 0442  NA 143  --  141 142 144  K 4.0  --  3.9 4.2 4.1  CL 104  --  104 102 105  CO2 29  --  28 31 31   GLUCOSE 121*  --  178* 165* 127*  BUN 16  --  26* 34* 29*  CREATININE 0.86 0.80 0.81 0.87 0.71  CALCIUM 8.8*  --  8.7* 8.7* 8.6*   Liver Function Tests:  Recent Labs Lab 04/08/15 0442  AST 25  ALT 28  ALKPHOS 65  BILITOT 0.4  PROT 5.9*  ALBUMIN 3.3*   No results for input(s): LIPASE, AMYLASE in the last 168 hours. No results for input(s): AMMONIA in the last 168 hours. CBC:  Recent Labs Lab 04/05/15 0516 04/05/15 1030 04/06/15 0451 04/07/15 0512 04/08/15 0442  WBC 9.0 6.9 4.8 9.5 10.2  NEUTROABS 6.5  --   --   --  8.0*  HGB 14.8 14.1 13.4 13.1 13.0  HCT 44.6 43.3 41.2 39.5 40.0  MCV 96.3 96.4 96.0 96.1 96.6  PLT 193 183 188 201 198   Cardiac Enzymes: No results for input(s): CKTOTAL, CKMB, CKMBINDEX, TROPONINI in the last 168 hours. BNP (last 3 results)  Recent Labs  04/07/15 0512  BNP 26.0    ProBNP (last 3 results) No results for input(s): PROBNP in the last 8760 hours.  CBG: No results for input(s): GLUCAP in the last 168 hours.  Recent Results (from the past 240 hour(s))  Urine culture     Status: None   Collection Time: 04/05/15  5:35 AM  Result Value Ref Range Status   Specimen Description URINE, CATHETERIZED  Final   Special Requests NONE  Final   Culture   Final    NO GROWTH 1 DAY Performed at [facility]    Report Status 04/06/2015 FINAL  Final  MRSA PCR Screening     Status: None   Collection Time: 04/05/15 10:00 AM  Result Value Ref Range Status   MRSA by PCR NEGATIVE NEGATIVE Final    Comment:        The GeneXpert MRSA Assay (FDA approved for NASAL specimens only), is one component of a comprehensive MRSA colonization surveillance program. It is not intended to diagnose MRSA infection nor to guide or monitor treatment for MRSA infections.   Culture, blood (routine x 2) Call MD if unable to obtain prior to  antibiotics being given     Status: None (Preliminary result)   Collection Time: 04/05/15 10:30 AM  Result Value Ref Range Status   Specimen Description BLOOD LEFT ANTECUBITAL  Final   Special Requests BOTTLES DRAWN AEROBIC AND ANAEROBIC 10CC EACH  Final   Culture NO GROWTH 2 DAYS  Final   Report Status PENDING  Incomplete  Culture, blood (routine x 2) Call MD if unable to obtain prior to antibiotics being given     Status: None (Preliminary result)   Collection Time: 04/05/15 10:36 AM  Result Value Ref Range Status   Specimen Description BLOOD RIGHT HAND  Final   Special Requests BOTTLES DRAWN AEROBIC AND ANAEROBIC 10CC EACH  Final   Culture NO GROWTH 2 DAYS  Final   Report  Status PENDING  Incomplete     Studies: Dg Chest Port 1 View  04/07/2015  CLINICAL DATA:  Hypoxemia EXAM: PORTABLE CHEST 1 VIEW COMPARISON:  04/05/2015 and 06/27/2011 FINDINGS: Cardiomediastinal silhouette is stable. Hyperinflation again noted. Streaky mild basilar atelectasis or infiltrate with improvement in aeration. No pulmonary edema. IMPRESSION: Hyperinflation. Improvement in aeration. No pulmonary edema. Residual mild streaky basilar atelectasis or infiltrate Electronically Signed   By: Natasha Mead M.D.   On: 04/07/2015 08:03    Scheduled Meds: . aspirin EC  81 mg Oral Daily  . atorvastatin  10 mg Oral Daily  . azithromycin  500 mg Oral Daily  . diltiazem  300 mg Oral Daily  . enoxaparin (LOVENOX) injection  40 mg Subcutaneous Q24H  . famotidine  10 mg Oral BID  . furosemide  20 mg Oral Daily  . ipratropium-albuterol  3 mL Nebulization Q6H  . multivitamin with minerals  1 tablet Oral Daily  . pneumococcal 23 valent vaccine  0.5 mL Intramuscular Tomorrow-1000  . predniSONE  40 mg Oral QAC breakfast  . sodium chloride  3 mL Intravenous Q12H  . traZODone  100 mg Oral QHS   Continuous Infusions:   Principal Problem:   Acute on chronic respiratory failure (HCC) Active Problems:   COPD exacerbation (HCC)    CAP (community acquired pneumonia)    Time spent: 35 minutes. Greater than 50% of this time was spent in direct contact with the patient coordinating care.    Pleas Koch, MD [facility] [phone]

## 2015-04-08 NOTE — Progress Notes (Signed)
Pt in and off BIPAP through out the night.

## 2015-04-08 NOTE — Progress Notes (Signed)
Subjective: She is significantly better. She has required BiPAP off and on during the night but she's off BiPAP now and looks pretty comfortable. She is wearing nasal oxygen. She is being transitioned to oral treatment  Objective: Vital signs in last 24 hours: Temp:  [96.3 F (35.7 C)-97.5 F (36.4 C)] 97.3 F (36.3 C) (01/05 0400) Pulse Rate:  [62-96] 81 (01/05 0734) Resp:  [15-37] 26 (01/05 0734) BP: (102-129)/(61-84) 123/74 mmHg (01/05 0734) SpO2:  [92 %-100 %] 92 % (01/05 0734) FiO2 (%):  [36 %-40 %] 40 % (01/05 0400) Weight:  [86.5 kg (190 lb 11.2 oz)] 86.5 kg (190 lb 11.2 oz) (01/05 0500) Weight change: 1.1 kg (2 lb 6.8 oz) Last BM Date: 04/04/15  Intake/Output from previous day: 01/04 0701 - 01/05 0700 In: 843 [P.O.:840; I.V.:3] Out: 900 [Urine:900]  PHYSICAL EXAM General appearance: alert, cooperative and mild distress Resp: rhonchi bilaterally Cardio: regular rate and rhythm, S1, S2 normal, no murmur, click, rub or gallop GI: soft, non-tender; bowel sounds normal; no masses,  no organomegaly Extremities: extremities normal, atraumatic, no cyanosis or edema  Lab Results:  Results for orders placed or performed during the hospital encounter of 04/05/15 (from the past 48 hour(s))  Basic metabolic panel     Status: Abnormal   Collection Time: 04/07/15  5:12 AM  Result Value Ref Range   Sodium 142 135 - 145 mmol/L   Potassium 4.2 3.5 - 5.1 mmol/L   Chloride 102 101 - 111 mmol/L   CO2 31 22 - 32 mmol/L   Glucose, Bld 165 (H) 65 - 99 mg/dL   BUN 34 (H) 6 - 20 mg/dL   Creatinine, Ser 0.87 0.44 - 1.00 mg/dL   Calcium 8.7 (L) 8.9 - 10.3 mg/dL   GFR calc non Af Amer >60 >60 mL/min   GFR calc Af Amer >60 >60 mL/min    Comment: (NOTE) The eGFR has been calculated using the CKD EPI equation. This calculation has not been validated in all clinical situations. eGFR's persistently <60 mL/min signify possible Chronic Kidney Disease.    Anion gap 9 5 - 15  CBC     Status:  None   Collection Time: 04/07/15  5:12 AM  Result Value Ref Range   WBC 9.5 4.0 - 10.5 K/uL   RBC 4.11 3.87 - 5.11 MIL/uL   Hemoglobin 13.1 12.0 - 15.0 g/dL   HCT 39.5 36.0 - 46.0 %   MCV 96.1 78.0 - 100.0 fL   MCH 31.9 26.0 - 34.0 pg   MCHC 33.2 30.0 - 36.0 g/dL   RDW 13.3 11.5 - 15.5 %   Platelets 201 150 - 400 K/uL  Brain natriuretic peptide     Status: None   Collection Time: 04/07/15  5:12 AM  Result Value Ref Range   B Natriuretic Peptide 26.0 0.0 - 100.0 pg/mL  CBC with Differential/Platelet     Status: Abnormal   Collection Time: 04/08/15  4:42 AM  Result Value Ref Range   WBC 10.2 4.0 - 10.5 K/uL   RBC 4.14 3.87 - 5.11 MIL/uL   Hemoglobin 13.0 12.0 - 15.0 g/dL   HCT 40.0 36.0 - 46.0 %   MCV 96.6 78.0 - 100.0 fL   MCH 31.4 26.0 - 34.0 pg   MCHC 32.5 30.0 - 36.0 g/dL   RDW 13.4 11.5 - 15.5 %   Platelets 198 150 - 400 K/uL   Neutrophils Relative % 80 %   Neutro Abs 8.0 (H) 1.7 -  7.7 K/uL   Lymphocytes Relative 12 %   Lymphs Abs 1.2 0.7 - 4.0 K/uL   Monocytes Relative 8 %   Monocytes Absolute 0.8 0.1 - 1.0 K/uL   Eosinophils Relative 0 %   Eosinophils Absolute 0.0 0.0 - 0.7 K/uL   Basophils Relative 0 %   Basophils Absolute 0.0 0.0 - 0.1 K/uL   WBC Morphology ATYPICAL LYMPHOCYTES   Comprehensive metabolic panel     Status: Abnormal   Collection Time: 04/08/15  4:42 AM  Result Value Ref Range   Sodium 144 135 - 145 mmol/L   Potassium 4.1 3.5 - 5.1 mmol/L   Chloride 105 101 - 111 mmol/L   CO2 31 22 - 32 mmol/L   Glucose, Bld 127 (H) 65 - 99 mg/dL   BUN 29 (H) 6 - 20 mg/dL   Creatinine, Ser 0.71 0.44 - 1.00 mg/dL   Calcium 8.6 (L) 8.9 - 10.3 mg/dL   Total Protein 5.9 (L) 6.5 - 8.1 g/dL   Albumin 3.3 (L) 3.5 - 5.0 g/dL   AST 25 15 - 41 U/L   ALT 28 14 - 54 U/L   Alkaline Phosphatase 65 38 - 126 U/L   Total Bilirubin 0.4 0.3 - 1.2 mg/dL   GFR calc non Af Amer >60 >60 mL/min   GFR calc Af Amer >60 >60 mL/min    Comment: (NOTE) The eGFR has been calculated  using the CKD EPI equation. This calculation has not been validated in all clinical situations. eGFR's persistently <60 mL/min signify possible Chronic Kidney Disease.    Anion gap 8 5 - 15    ABGS No results for input(s): PHART, PO2ART, TCO2, HCO3 in the last 72 hours.  Invalid input(s): PCO2 CULTURES Recent Results (from the past 240 hour(s))  Urine culture     Status: None   Collection Time: 04/05/15  5:35 AM  Result Value Ref Range Status   Specimen Description URINE, CATHETERIZED  Final   Special Requests NONE  Final   Culture   Final    NO GROWTH 1 DAY Performed at [facility]    Report Status 04/06/2015 FINAL  Final  MRSA PCR Screening     Status: None   Collection Time: 04/05/15 10:00 AM  Result Value Ref Range Status   MRSA by PCR NEGATIVE NEGATIVE Final    Comment:        The GeneXpert MRSA Assay (FDA approved for NASAL specimens only), is one component of a comprehensive MRSA colonization surveillance program. It is not intended to diagnose MRSA infection nor to guide or monitor treatment for MRSA infections.   Culture, blood (routine x 2) Call MD if unable to obtain prior to antibiotics being given     Status: None (Preliminary result)   Collection Time: 04/05/15 10:30 AM  Result Value Ref Range Status   Specimen Description BLOOD LEFT ANTECUBITAL  Final   Special Requests BOTTLES DRAWN AEROBIC AND ANAEROBIC 10CC EACH  Final   Culture NO GROWTH 2 DAYS  Final   Report Status PENDING  Incomplete  Culture, blood (routine x 2) Call MD if unable to obtain prior to antibiotics being given     Status: None (Preliminary result)   Collection Time: 04/05/15 10:36 AM  Result Value Ref Range Status   Specimen Description BLOOD RIGHT HAND  Final   Special Requests BOTTLES DRAWN AEROBIC AND ANAEROBIC 10CC EACH  Final   Culture NO GROWTH 2 DAYS  Final   Report Status  PENDING  Incomplete   Studies/Results: Dg Chest Port 1 View  04/07/2015  CLINICAL DATA:   Hypoxemia EXAM: PORTABLE CHEST 1 VIEW COMPARISON:  04/05/2015 and 06/27/2011 FINDINGS: Cardiomediastinal silhouette is stable. Hyperinflation again noted. Streaky mild basilar atelectasis or infiltrate with improvement in aeration. No pulmonary edema. IMPRESSION: Hyperinflation. Improvement in aeration. No pulmonary edema. Residual mild streaky basilar atelectasis or infiltrate Electronically Signed   By: Lahoma Crocker M.D.   On: 04/07/2015 08:03    Medications:  Prior to Admission:  Prescriptions prior to admission  Medication Sig Dispense Refill Last Dose  . albuterol (PROAIR HFA) 108 (90 BASE) MCG/ACT inhaler Inhale 2 puffs into the lungs every 6 (six) hours as needed for wheezing or shortness of breath.   Past Week at Unknown time  . aspirin EC 81 MG tablet Take 81 mg by mouth daily.   04/04/2015 at Unknown time  . atorvastatin (LIPITOR) 10 MG tablet Take 10 mg by mouth daily.   04/04/2015 at Unknown time  . calcium carbonate (TUMS - DOSED IN MG ELEMENTAL CALCIUM) 500 MG chewable tablet Chew 1 tablet by mouth as needed.   Past Week at Unknown time  . diltiazem (CARDIZEM CD) 300 MG 24 hr capsule Take 1 capsule (300 mg total) by mouth daily. 30 capsule 11 04/04/2015 at Unknown time  . famotidine (PEPCID) 10 MG tablet Take 10 mg by mouth 2 (two) times daily.   Past Week at Unknown time  . furosemide (LASIX) 20 MG tablet Take 1 tab daily as needed for swelling 90 tablet 3 Past Week at Unknown time  . ipratropium-albuterol (DUONEB) 0.5-2.5 (3) MG/3ML SOLN Take 3 mLs by nebulization 4 (four) times daily.    Past Week at Unknown time  . Multiple Vitamin (MULTIVITAMIN) tablet Take 1 tablet by mouth daily.   04/04/2015 at Unknown time  . traZODone (DESYREL) 100 MG tablet Take 100 mg by mouth at bedtime.   04/04/2015 at Unknown time   Scheduled: . aspirin EC  81 mg Oral Daily  . atorvastatin  10 mg Oral Daily  . azithromycin  500 mg Oral Daily  . diltiazem  300 mg Oral Daily  . enoxaparin (LOVENOX) injection  40  mg Subcutaneous Q24H  . famotidine  10 mg Oral BID  . furosemide  20 mg Oral Daily  . ipratropium-albuterol  3 mL Nebulization Q6H  . multivitamin with minerals  1 tablet Oral Daily  . pneumococcal 23 valent vaccine  0.5 mL Intramuscular Tomorrow-1000  . predniSONE  40 mg Oral QAC breakfast  . sodium chloride  3 mL Intravenous Q12H  . traZODone  100 mg Oral QHS   Continuous:  XYV:OPFYTW chloride, acetaminophen **OR** acetaminophen, albuterol, ondansetron **OR** ondansetron (ZOFRAN) IV, polyethylene glycol, senna-docusate, sodium chloride  Assesment: She has acute on chronic respiratory failure from COPD exacerbation and community-acquired pneumonia. She is improving. She is requiring the BiPAP less frequently. She is transitioning to oral medications. Principal Problem:   Acute on chronic respiratory failure (HCC) Active Problems:   COPD exacerbation ([place])   CAP (community acquired pneumonia)    Plan: Continue current treatments. She may end up requiring BiPAP at home. I told her I will be happy to serve as her pulmonary physician as an outpatient    LOS: 3 days   Dieter Hane L 04/08/2015, 7:49 AM

## 2015-04-09 ENCOUNTER — Inpatient Hospital Stay (HOSPITAL_COMMUNITY): Payer: Medicare HMO

## 2015-04-09 LAB — BLOOD GAS, ARTERIAL
ACID-BASE EXCESS: 8.5 mmol/L — AB (ref 0.0–2.0)
Acid-Base Excess: 7 mmol/L — ABNORMAL HIGH (ref 0.0–2.0)
BICARBONATE: 30.1 meq/L — AB (ref 20.0–24.0)
BICARBONATE: 31.8 meq/L — AB (ref 20.0–24.0)
Delivery systems: POSITIVE
EXPIRATORY PAP: 8
FIO2: 40
FIO2: 31
INSPIRATORY PAP: 16
O2 SAT: 95.9 %
O2 Saturation: 89.4 %
PCO2 ART: 48.9 mmHg — AB (ref 35.0–45.0)
PCO2 ART: 42.6 mmHg (ref 35.0–45.0)
PH ART: 7.424 (ref 7.350–7.450)
PH ART: 7.492 — AB (ref 7.350–7.450)
PO2 ART: 83 mmHg (ref 80.0–100.0)
pO2, Arterial: 56.4 mmHg — ABNORMAL LOW (ref 80.0–100.0)

## 2015-04-09 MED ORDER — FUROSEMIDE 10 MG/ML IJ SOLN
40.0000 mg | Freq: Once | INTRAMUSCULAR | Status: AC
  Administered 2015-04-09: 40 mg via INTRAVENOUS
  Filled 2015-04-09: qty 4

## 2015-04-09 MED ORDER — FUROSEMIDE 40 MG PO TABS
40.0000 mg | ORAL_TABLET | Freq: Two times a day (BID) | ORAL | Status: DC
  Administered 2015-04-10 (×2): 40 mg via ORAL
  Filled 2015-04-09 (×2): qty 1

## 2015-04-09 MED ORDER — FUROSEMIDE 40 MG PO TABS: 40.0000 mg | ORAL_TABLET | Freq: Two times a day (BID) | ORAL | Status: DC

## 2015-04-09 MED ORDER — LORAZEPAM 0.5 MG PO TABS
0.5000 mg | ORAL_TABLET | Freq: Four times a day (QID) | ORAL | Status: DC | PRN
  Administered 2015-04-09 – 2015-04-14 (×12): 0.5 mg via ORAL
  Filled 2015-04-09 (×12): qty 1

## 2015-04-09 MED ORDER — METHYLPREDNISOLONE SODIUM SUCC 125 MG IJ SOLR
80.0000 mg | Freq: Four times a day (QID) | INTRAMUSCULAR | Status: DC
  Administered 2015-04-09 – 2015-04-13 (×17): 80 mg via INTRAVENOUS
  Filled 2015-04-09 (×17): qty 2

## 2015-04-09 NOTE — Progress Notes (Signed)
[facility] PROGRESS NOTE  Margaret Stokes ZOX:[id number] DOB: [birth year] DOA: 04/05/2015 PCP: Briscoe Burns, PA-C  64 ? 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 Edema? Vascular congestion + COPD findings Pulmonology consult  Subjective  Waxing and waning breathing and appears worse this am accessory muscles in use   Assessment/Plan:  Acute on chronic respiratory failure -Waxing and waning-worsened overnight 1/5-1/6 am, -Transition back to IV Solu-Medrol from prednisone 04/09/15 -appreciate pulmonary input--ABG and CXR this am relatively unchanged -Multifactorial secondary to COPD with acute exacerbation, community-acquired pneumonia and diastolic CHF -added low dose roxanol 0.25 for dyspnea -baseline functional state is diminished -Continue albuterol 2.5 every 2 when necessary, urinary in addition 3 meals every 6 scheduled  COPD with acute exacerbation -Mild wheezes. -Continue steroids at current dose, nebs, antibiotics aithro daily till 04/10/15  Acute superimposed on Copd exacerbation -hasn't been febrile so unlikely PNA -All culture data remains negative to date. -wean off Rocephin 2 azithromycin monotherapy at present time -There was some concern of possible bacteriuria however her urine cultures negative so no need for Rocephin coverage  Acute on chronic diastolic CHF -Echo in November 2015 shows normal ejection fraction with grade 1 diastolic dysfunction. 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 Monitor on telemetry currently on Cardizem 300  daily which controls her HR Re-consider AC per Cardiology as OP  acute kidney injury Probably from Lasix use, transitioned to by mouth 04/07/15  hyperlipidemia Continue atorvastatin 10 daily  clamp Foley and discontinue  Code Status: Full code Family Communication: Patient only  Disposition Plan: Keep in ICU given tenuous respiratory status    Consultants:  Pulmonary   Antibiotics:  Rocephin  Azithromycin   Subjective:  More winded Placed BI-pap overnight Upset about discussion about Intubation and upset cannot eat    Objective: Filed Vitals:   04/09/15 0500 04/09/15 0600 04/09/15 0711 04/09/15 0810  BP: 133/81 122/80    Pulse: 66 84    Temp:    97.1 F (36.2 C)  TempSrc:    Oral  Resp: 21 23    Height: 5\' 9"  (1.753 m)     Weight: 86.9 kg (191 lb 9.3 oz)     SpO2: 99% 95% 95%     Intake/Output Summary (Last 24 hours) at 04/09/15 0851 Last data filed at 04/08/15 2157  Gross per 24 hour  Intake    300 ml  Output   1225 ml  Net   -925 ml   Filed Weights   04/07/15 0500 04/08/15 0500 04/09/15 0500  Weight: 85.4 kg (188 lb 4.4 oz) 86.5 kg (190 lb 11.2 oz) 86.9 kg (191 lb 9.3 oz)    Exam:   General:  Alert, awake, oriented 3  Cardiovascular: Tachycardic  Respiratory: Decreased breath sounds  Abdomen: Soft, nontender, nondistended  Extremities: 1+ pitting edema bilaterally   Neurologic:  Grossly intact and nonfocal  Data Reviewed: Basic Metabolic Panel:  Recent Labs Lab 04/05/15 0516 04/05/15 1030 04/06/15 0451 04/07/15 0512 04/08/15 0442  NA 143  --  141 142 144  K 4.0  --  3.9 4.2 4.1  CL 104  --  104  102 105  CO2 29  --  28 31 31   GLUCOSE 121*  --  178* 165* 127*  BUN 16  --  26* 34* 29*  CREATININE 0.86 0.80 0.81 0.87 0.71  CALCIUM 8.8*  --  8.7* 8.7* 8.6*   Liver Function Tests:  Recent Labs Lab 04/08/15 0442  AST 25  ALT 28  ALKPHOS 65  BILITOT 0.4  PROT 5.9*  ALBUMIN 3.3*   No results for input(s): LIPASE,  AMYLASE in the last 168 hours. No results for input(s): AMMONIA in the last 168 hours. CBC:  Recent Labs Lab 04/05/15 0516 04/05/15 1030 04/06/15 0451 04/07/15 0512 04/08/15 0442  WBC 9.0 6.9 4.8 9.5 10.2  NEUTROABS 6.5  --   --   --  8.0*  HGB 14.8 14.1 13.4 13.1 13.0  HCT 44.6 43.3 41.2 39.5 40.0  MCV 96.3 96.4 96.0 96.1 96.6  PLT 193 183 188 201 198   Cardiac Enzymes: No results for input(s): CKTOTAL, CKMB, CKMBINDEX, TROPONINI in the last 168 hours. BNP (last 3 results)  Recent Labs  04/07/15 0512  BNP 26.0    ProBNP (last 3 results) No results for input(s): PROBNP in the last 8760 hours.  CBG: No results for input(s): GLUCAP in the last 168 hours.  Recent Results (from the past 240 hour(s))  Urine culture     Status: None   Collection Time: 04/05/15  5:35 AM  Result Value Ref Range Status   Specimen Description URINE, CATHETERIZED  Final   Special Requests NONE  Final   Culture   Final    NO GROWTH 1 DAY Performed at [facility]    Report Status 04/06/2015 FINAL  Final  MRSA PCR Screening     Status: None   Collection Time: 04/05/15 10:00 AM  Result Value Ref Range Status   MRSA by PCR NEGATIVE NEGATIVE Final    Comment:        The GeneXpert MRSA Assay (FDA approved for NASAL specimens only), is one component of a comprehensive MRSA colonization surveillance program. It is not intended to diagnose MRSA infection nor to guide or monitor treatment for MRSA infections.   Culture, blood (routine x 2) Call MD if unable to obtain prior to antibiotics being given     Status: None (Preliminary result)   Collection Time: 04/05/15 10:30 AM  Result Value Ref Range Status   Specimen Description BLOOD LEFT ANTECUBITAL  Final   Special Requests BOTTLES DRAWN AEROBIC AND ANAEROBIC 10CC EACH  Final   Culture NO GROWTH 4 DAYS  Final   Report Status PENDING  Incomplete  Culture, blood (routine x 2) Call MD if unable to obtain prior to antibiotics being  given     Status: None (Preliminary result)   Collection Time: 04/05/15 10:36 AM  Result Value Ref Range Status   Specimen Description BLOOD RIGHT HAND  Final   Special Requests BOTTLES DRAWN AEROBIC AND ANAEROBIC 10CC EACH  Final   Culture NO GROWTH 4 DAYS  Final   Report Status PENDING  Incomplete     Studies: No results found.  Scheduled Meds: . aspirin EC  81 mg Oral Daily  . atorvastatin  10 mg Oral Daily  . azithromycin  500 mg Oral Daily  . diltiazem  300 mg Oral Daily  . enoxaparin (LOVENOX) injection  40 mg Subcutaneous Q24H  . famotidine  10 mg Oral BID  . furosemide  20 mg Oral Daily  . ipratropium-albuterol  3  mL Nebulization Q6H  . methylPREDNISolone (SOLU-MEDROL) injection  80 mg Intravenous Q6H  . multivitamin with minerals  1 tablet Oral Daily  . sodium chloride  3 mL Intravenous Q12H  . traZODone  100 mg Oral QHS   Continuous Infusions:   Principal Problem:   Acute on chronic respiratory failure (HCC) Active Problems:   COPD exacerbation (HCC)   CAP (community acquired pneumonia)    Time spent: 15 minutes. Greater than 50% of this time was spent in direct contact with the patient coordinating care.    Pleas KochJai Zonia Caplin, MD [facility] [phone]([phone]

## 2015-04-09 NOTE — Progress Notes (Signed)
Subjective: She seems to have significantly deteriorated over the last 24 hours. She is more short of breath. She is still wearing BiPAP but clearly more short of breath than yesterday. She has developed hypoxia tachypnea and tachycardia with minimal exertion even adding on the bedpan or bedside commode. She is coughing and cough seems to set off episodes of hypoxia tachypnea and tachycardia also  Objective: Vital signs in last 24 hours: Temp:  [97 F (36.1 C)-98 F (36.7 C)] 97 F (36.1 C) (01/05 2339) Pulse Rate:  [59-102] 84 (01/06 0600) Resp:  [15-32] 23 (01/06 0600) BP: (96-138)/(60-104) 122/80 mmHg (01/06 0600) SpO2:  [92 %-99 %] 95 % (01/06 0711) FiO2 (%):  [31 %-40 %] 40 % (01/06 0711) Weight:  [86.9 kg (191 lb 9.3 oz)] 86.9 kg (191 lb 9.3 oz) (01/06 0500) Weight change: 0.4 kg (14.1 oz) Last BM Date: 04/04/15  Intake/Output from previous day: 01/05 0701 - 01/06 0700 In: 790 [P.O.:720; I.V.:70] Out: 1225 [Urine:1225]  PHYSICAL EXAM General appearance: alert and Dyspneic at rest on BiPAP Resp: She's not moving air as well. Cardio: regular rate and rhythm, S1, S2 normal, no murmur, click, rub or gallop GI: soft, non-tender; bowel sounds normal; no masses,  no organomegaly Extremities: extremities normal, atraumatic, no cyanosis or edema  Lab Results:  Results for orders placed or performed during the hospital encounter of 04/05/15 (from the past 48 hour(s))  CBC with Differential/Platelet     Status: Abnormal   Collection Time: 04/08/15  4:42 AM  Result Value Ref Range   WBC 10.2 4.0 - 10.5 K/uL   RBC 4.14 3.87 - 5.11 MIL/uL   Hemoglobin 13.0 12.0 - 15.0 g/dL   HCT 40.0 36.0 - 46.0 %   MCV 96.6 78.0 - 100.0 fL   MCH 31.4 26.0 - 34.0 pg   MCHC 32.5 30.0 - 36.0 g/dL   RDW 13.4 11.5 - 15.5 %   Platelets 198 150 - 400 K/uL   Neutrophils Relative % 80 %   Neutro Abs 8.0 (H) 1.7 - 7.7 K/uL   Lymphocytes Relative 12 %   Lymphs Abs 1.2 0.7 - 4.0 K/uL   Monocytes  Relative 8 %   Monocytes Absolute 0.8 0.1 - 1.0 K/uL   Eosinophils Relative 0 %   Eosinophils Absolute 0.0 0.0 - 0.7 K/uL   Basophils Relative 0 %   Basophils Absolute 0.0 0.0 - 0.1 K/uL   WBC Morphology ATYPICAL LYMPHOCYTES   Comprehensive metabolic panel     Status: Abnormal   Collection Time: 04/08/15  4:42 AM  Result Value Ref Range   Sodium 144 135 - 145 mmol/L   Potassium 4.1 3.5 - 5.1 mmol/L   Chloride 105 101 - 111 mmol/L   CO2 31 22 - 32 mmol/L   Glucose, Bld 127 (H) 65 - 99 mg/dL   BUN 29 (H) 6 - 20 mg/dL   Creatinine, Ser 0.71 0.44 - 1.00 mg/dL   Calcium 8.6 (L) 8.9 - 10.3 mg/dL   Total Protein 5.9 (L) 6.5 - 8.1 g/dL   Albumin 3.3 (L) 3.5 - 5.0 g/dL   AST 25 15 - 41 U/L   ALT 28 14 - 54 U/L   Alkaline Phosphatase 65 38 - 126 U/L   Total Bilirubin 0.4 0.3 - 1.2 mg/dL   GFR calc non Af Amer >60 >60 mL/min   GFR calc Af Amer >60 >60 mL/min    Comment: (NOTE) The eGFR has been calculated using the CKD EPI  equation. This calculation has not been validated in all clinical situations. eGFR's persistently <60 mL/min signify possible Chronic Kidney Disease.    Anion gap 8 5 - 15  Blood gas, arterial     Status: Abnormal   Collection Time: 04/08/15  9:15 PM  Result Value Ref Range   FIO2 40.00    Delivery systems BILEVEL POSITIVE AIRWAY PRESSURE    Inspiratory PAP 16.0    Expiratory PAP 8.0    pH, Arterial 7.403 7.350 - 7.450   pCO2 arterial 51.2 (H) 35.0 - 45.0 mmHg   pO2, Arterial 82.1 80.0 - 100.0 mmHg   Bicarbonate 29.5 (H) 20.0 - 24.0 mEq/L   TCO2 18.1 0 - 100 mmol/L   Acid-Base Excess 6.5 (H) 0.0 - 2.0 mmol/L   O2 Saturation 95.3 %   Collection site RIGHT RADIAL    Drawn by [id number]    Sample type ARTERIAL    Allens test (pass/fail) PASS PASS    ABGS  Recent Labs  04/08/15 2115  PHART 7.403  PO2ART 82.1  TCO2 18.1  HCO3 29.5*   CULTURES Recent Results (from the past 240 hour(s))  Urine culture     Status: None   Collection Time: 04/05/15  5:35 AM   Result Value Ref Range Status   Specimen Description URINE, CATHETERIZED  Final   Special Requests NONE  Final   Culture   Final    NO GROWTH 1 DAY Performed at [facility]    Report Status 04/06/2015 FINAL  Final  MRSA PCR Screening     Status: None   Collection Time: 04/05/15 10:00 AM  Result Value Ref Range Status   MRSA by PCR NEGATIVE NEGATIVE Final    Comment:        The GeneXpert MRSA Assay (FDA approved for NASAL specimens only), is one component of a comprehensive MRSA colonization surveillance program. It is not intended to diagnose MRSA infection nor to guide or monitor treatment for MRSA infections.   Culture, blood (routine x 2) Call MD if unable to obtain prior to antibiotics being given     Status: None (Preliminary result)   Collection Time: 04/05/15 10:30 AM  Result Value Ref Range Status   Specimen Description BLOOD LEFT ANTECUBITAL  Final   Special Requests BOTTLES DRAWN AEROBIC AND ANAEROBIC 10CC EACH  Final   Culture NO GROWTH 4 DAYS  Final   Report Status PENDING  Incomplete  Culture, blood (routine x 2) Call MD if unable to obtain prior to antibiotics being given     Status: None (Preliminary result)   Collection Time: 04/05/15 10:36 AM  Result Value Ref Range Status   Specimen Description BLOOD RIGHT HAND  Final   Special Requests BOTTLES DRAWN AEROBIC AND ANAEROBIC 10CC EACH  Final   Culture NO GROWTH 4 DAYS  Final   Report Status PENDING  Incomplete   Studies/Results: No results found.  Medications:  Prior to Admission:  Prescriptions prior to admission  Medication Sig Dispense Refill Last Dose  . albuterol (PROAIR HFA) 108 (90 BASE) MCG/ACT inhaler Inhale 2 puffs into the lungs every 6 (six) hours as needed for wheezing or shortness of breath.   Past Week at Unknown time  . aspirin EC 81 MG tablet Take 81 mg by mouth daily.   04/04/2015 at Unknown time  . atorvastatin (LIPITOR) 10 MG tablet Take 10 mg by mouth daily.   04/04/2015 at  Unknown time  . calcium carbonate (TUMS - DOSED IN  MG ELEMENTAL CALCIUM) 500 MG chewable tablet Chew 1 tablet by mouth as needed.   Past Week at Unknown time  . diltiazem (CARDIZEM CD) 300 MG 24 hr capsule Take 1 capsule (300 mg total) by mouth daily. 30 capsule 11 04/04/2015 at Unknown time  . famotidine (PEPCID) 10 MG tablet Take 10 mg by mouth 2 (two) times daily.   Past Week at Unknown time  . furosemide (LASIX) 20 MG tablet Take 1 tab daily as needed for swelling 90 tablet 3 Past Week at Unknown time  . ipratropium-albuterol (DUONEB) 0.5-2.5 (3) MG/3ML SOLN Take 3 mLs by nebulization 4 (four) times daily.    Past Week at Unknown time  . Multiple Vitamin (MULTIVITAMIN) tablet Take 1 tablet by mouth daily.   04/04/2015 at Unknown time  . traZODone (DESYREL) 100 MG tablet Take 100 mg by mouth at bedtime.   04/04/2015 at Unknown time   Scheduled: . aspirin EC  81 mg Oral Daily  . atorvastatin  10 mg Oral Daily  . azithromycin  500 mg Oral Daily  . diltiazem  300 mg Oral Daily  . enoxaparin (LOVENOX) injection  40 mg Subcutaneous Q24H  . famotidine  10 mg Oral BID  . furosemide  20 mg Oral Daily  . ipratropium-albuterol  3 mL Nebulization Q6H  . methylPREDNISolone (SOLU-MEDROL) injection  80 mg Intravenous Q6H  . multivitamin with minerals  1 tablet Oral Daily  . sodium chloride  3 mL Intravenous Q12H  . traZODone  100 mg Oral QHS   Continuous:  ILN:ZVJKQA chloride, acetaminophen **OR** acetaminophen, albuterol, morphine CONCENTRATE, ondansetron **OR** ondansetron (ZOFRAN) IV, polyethylene glycol, senna-docusate, sodium chloride  Assesment: She has acute on chronic respiratory failure. She had been improving steadily but has had deterioration over the last 24 hours. She had blood gas last night that showed her PCO2 was still in the 50s. Her pneumonia seems to be improving.    With the fairly acute deterioration considerations would be worsening of COPD, congestive heart failure. However she is  negative about 1.5 L since admission. Principal Problem:   Acute on chronic respiratory failure (HCC) Active Problems:   COPD exacerbation ([place])   CAP (community acquired pneumonia)    Plan: I put her back on IV steroids. Continue antibiotics. Repeat blood gas and chest x-ray. She is again at high risk for intubation and mechanical ventilation    LOS: 4 days   Devani Odonnel L 04/09/2015, 7:46 AM

## 2015-04-09 NOTE — Progress Notes (Signed)
eLink Physician-Brief Progress Note Patient Name: Margaret Stokes DOB: 08/15/1950 MRN: [id number]   Date of Service  04/09/2015  HPI/Events of Note  [facility]  Camera  - able to talk full sentences - stooping fwd sitting position - face mask o2 -> pulse ox 89% - RR > 25 but not paradoxical - uses acc muscle  eICU Interventions  cxr stat abg stat     Intervention Category Major Interventions: Respiratory failure - evaluation and management  Cosimo Schertzer 04/09/2015, 6:22 PM

## 2015-04-09 NOTE — Progress Notes (Addendum)
Increased oxygen to 40 from 31 , patient has extreme anxiety she gets excited if she is off oxygen even for a few minutes , increased work of breathing pure anxiety. Saturation will be the same until she gets worked up. Also states she cannot breath through her nose so nasal cannula will not work. Former Engineer, civil (consulting)nurse.

## 2015-04-09 NOTE — Progress Notes (Signed)
eLink Physician-Brief Progress Note Patient Name: Margaret Stokes DOB: 08/12/1950 MRN: [id number]   Date of Service  04/09/2015  HPI/Events of Note  ucnhanged cxr  Recent Labs Lab 04/05/15 0530 04/08/15 2115 04/09/15 0747 04/09/15 1828  PHART 7.327* 7.403 7.424 7.492*  PCO2ART 52.8* 51.2* 48.9* 42.6  PO2ART 229* 82.1 83.0 56.4*  HCO3 24.8* 29.5* 30.1* 31.8*  TCO2 20.8 18.1  --   --   O2SAT 99.1 95.3 95.9 89.4     eICU Interventions  Continue to mionitor     Intervention Category Intermediate Interventions: Respiratory distress - evaluation and management  Charyl Minervini 04/09/2015, 8:24 PM

## 2015-04-10 ENCOUNTER — Inpatient Hospital Stay (HOSPITAL_COMMUNITY): Payer: Medicare HMO

## 2015-04-10 LAB — CULTURE, BLOOD (ROUTINE X 2)
Culture: NO GROWTH
Culture: NO GROWTH

## 2015-04-10 MED ORDER — BENZONATATE 100 MG PO CAPS: 200.0000 mg | ORAL_CAPSULE | Freq: Three times a day (TID) | ORAL | Status: DC | PRN

## 2015-04-10 MED ORDER — HYDROCODONE-HOMATROPINE 5-1.5 MG/5ML PO SYRP
5.0000 mL | ORAL_SOLUTION | ORAL | Status: DC | PRN
  Administered 2015-04-10 – 2015-04-14 (×8): 5 mL via ORAL
  Filled 2015-04-10 (×9): qty 5

## 2015-04-10 NOTE — Progress Notes (Signed)
[facility] PROGRESS NOTE  Margaret Stokes ZOX:[id number] DOB: 03/17/1951 DOA: 04/05/2015 PCP: Briscoe BurnsSLATE,SCOTT, PA-C  64 ? 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 Edema? Vascular congestion + COPD findings Pulmonology consult  Subjective  Waxing and waning breathing and appears worse this am accessory muscles in use   Assessment/Plan:  Acute on chronic respiratory failure -Waxing and waning-worsened overnight 1/5-1/6 am, -Transition back to IV Solu-Medrol from prednisone 04/09/15 -appreciate pulmonary input--ABG and CXR this am relatively unchanged -Multifactorial secondary to COPD with acute exacerbation, community-acquired pneumonia and diastolic CHF -added low dose roxanol 0.25 for dyspnea -baseline functional state is diminished -Continue albuterol 2.5 every 2 when necessary, urinary in addition 3 meals every 6 scheduled -lasix IV added 1/6 with I/o now net -4.3  COPD with acute exacerbation -Mild wheezes. -Continue steroids at current dose, nebs, antibiotics aitthro daily till 04/10/15  Acute superimposed on Copd exacerbation -hasn't been febrile so unlikely PNA -All culture data remains negative to date. -wean off Rocephin 2 azithromycin monotherapy at present time -There was some concern of possible bacteriuria however her urine cultures negative so no need for Rocephin coverage.  Acute on chronic diastolic CHF -Echo in November 2015 shows normal ejection fraction with grade 1 diastolic dysfunction. 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 Monitor on  telemetry currently on Cardizem 300 daily which controls her HR Re-consider AC per Cardiology as OP  acute kidney injury Probably from Lasix use, transitioned to by mouth 04/07/15  hyperlipidemia Continue atorvastatin 10 daily  clamp Foley and discontinue  Code Status: Full code Family Communication: Patient only  Disposition Plan: Keep in ICU given tenuous respiratory status    Consultants:  Pulmonary   Antibiotics:  Rocephin  Azithromycin   Subjective:  Fair Improved form yesterday tol diet Less winded Note some tachypnea overnight    Objective: Filed Vitals:   04/10/15 1300 04/10/15 1400 04/10/15 1459 04/10/15 1500  BP: 104/65 100/64  106/74  Pulse: 83 80  88  Temp:      TempSrc:      Resp: 28 23  24   Height:      Weight:      SpO2: 94% 90% 92% 92%    Intake/Output Summary (Last 24 hours) at 04/10/15 1600 Last data filed at 04/10/15 1459  Gross per 24 hour  Intake    480 ml  Output   3250 ml  Net  -2770 ml   Filed Weights   04/08/15 0500 04/09/15 0500 04/10/15 0400  Weight: 86.5 kg (190 lb 11.2 oz) 86.9 kg (191 lb 9.3 oz) 82.5 kg (181 lb 14.1 oz)    Exam:   General:  Alert, awake, oriented 3  Cardiovascular: Tachycardic  Respiratory: BS better in post lung fields  Abdomen: Soft, nontender, nondistended  Extremities: 1+ pitting edema bilaterally   Neurologic:  Grossly intact and nonfocal  Data Reviewed: Basic Metabolic Panel:  Recent Labs Lab 04/05/15 0516 04/05/15 1030 04/06/15 0451 04/07/15 0512 04/08/15 0442  NA 143  --  141 142 144  K 4.0  --  3.9 4.2 4.1  CL 104  --  104 102 105  CO2 29  --  28 31 31   GLUCOSE 121*  --  178* 165* 127*  BUN 16  --  26* 34* 29*  CREATININE 0.86 0.80 0.81 0.87 0.71  CALCIUM 8.8*  --  8.7* 8.7* 8.6*   Liver Function Tests:  Recent Labs Lab 04/08/15 0442  AST 25  ALT 28  ALKPHOS 65  BILITOT 0.4  PROT 5.9*  ALBUMIN 3.3*   No results for input(s): LIPASE, AMYLASE in the last  168 hours. No results for input(s): AMMONIA in the last 168 hours. CBC:  Recent Labs Lab 04/05/15 0516 04/05/15 1030 04/06/15 0451 04/07/15 0512 04/08/15 0442  WBC 9.0 6.9 4.8 9.5 10.2  NEUTROABS 6.5  --   --   --  8.0*  HGB 14.8 14.1 13.4 13.1 13.0  HCT 44.6 43.3 41.2 39.5 40.0  MCV 96.3 96.4 96.0 96.1 96.6  PLT 193 183 188 201 198   Cardiac Enzymes: No results for input(s): CKTOTAL, CKMB, CKMBINDEX, TROPONINI in the last 168 hours. BNP (last 3 results)  Recent Labs  04/07/15 0512  BNP 26.0    ProBNP (last 3 results) No results for input(s): PROBNP in the last 8760 hours.  CBG: No results for input(s): GLUCAP in the last 168 hours.  Recent Results (from the past 240 hour(s))  Urine culture     Status: None   Collection Time: 04/05/15  5:35 AM  Result Value Ref Range Status   Specimen Description URINE, CATHETERIZED  Final   Special Requests NONE  Final   Culture   Final    NO GROWTH 1 DAY Performed at [facility]    Report Status 04/06/2015 FINAL  Final  MRSA PCR Screening     Status: None   Collection Time: 04/05/15 10:00 AM  Result Value Ref Range Status   MRSA by PCR NEGATIVE NEGATIVE Final    Comment:        The GeneXpert MRSA Assay (FDA approved for NASAL specimens only), is one component of a comprehensive MRSA colonization surveillance program. It is not intended to diagnose MRSA infection nor to guide or monitor treatment for MRSA infections.   Culture, blood (routine x 2) Call MD if unable to obtain prior to antibiotics being given     Status: None   Collection Time: 04/05/15 10:30 AM  Result Value Ref Range Status   Specimen Description BLOOD LEFT ANTECUBITAL  Final   Special Requests BOTTLES DRAWN AEROBIC AND ANAEROBIC 10CC EACH  Final   Culture NO GROWTH 5 DAYS  Final   Report Status 04/10/2015 FINAL  Final  Culture, blood (routine x 2) Call MD if unable to obtain prior to antibiotics being given     Status: None   Collection  Time: 04/05/15 10:36 AM  Result Value Ref Range Status   Specimen Description BLOOD RIGHT HAND  Final   Special Requests BOTTLES DRAWN AEROBIC AND ANAEROBIC 10CC EACH  Final   Culture NO GROWTH 5 DAYS  Final   Report Status 04/10/2015 FINAL  Final     Studies: Dg Chest Port 1 View  04/09/2015  CLINICAL DATA:  Respiratory failure. EXAM: PORTABLE CHEST 1 VIEW COMPARISON:  04/07/2015 . FINDINGS: Mediastinum and hilar structures normal. Lungs are clear. Stable mild cardiomegaly. No pulmonary venous congestion . No pleural effusion or pneumothorax. IMPRESSION: 1. Stable mild cardiomegaly.  No pulmonary venous congestion. 2. No acute pulmonary disease . Electronically Signed   By: Maisie Fus  Register   On: 04/09/2015 09:13   Dg Chest Port 1v  Same Day  04/09/2015  CLINICAL DATA:  Increased shortness of breath. History of SVT, COPD. EXAM: PORTABLE CHEST 1 VIEW COMPARISON:  Chest x-rays dated 04/09/2015, 04/07/2015 and 05/30/2011. FINDINGS: Heart size is normal. Overall cardiomediastinal silhouette is stable in size and configuration. 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 No confluent airspace opacity to suggest a developing pneumonia. No pleural effusion seen. Osseous structures about the chest are unremarkable. IMPRESSION: 1. COPD/emphysema. 2. No evidence of acute cardiopulmonary abnormality. Electronically Signed   By: Bary Richard M.D.   On: 04/09/2015 18:58    Scheduled Meds: . aspirin EC  81 mg Oral Daily  . atorvastatin  10 mg Oral Daily  . azithromycin  500 mg Oral Daily  . diltiazem  300 mg Oral Daily  . enoxaparin (LOVENOX) injection  40 mg Subcutaneous Q24H  . famotidine  10 mg Oral BID  . furosemide  40 mg Oral BID  . ipratropium-albuterol  3 mL Nebulization Q6H  . methylPREDNISolone (SOLU-MEDROL) injection  80 mg Intravenous Q6H  . multivitamin with minerals  1 tablet Oral Daily  . sodium chloride  3 mL Intravenous Q12H  . traZODone  100 mg Oral QHS    Continuous Infusions:   Principal Problem:   Acute on chronic respiratory failure (HCC) Active Problems:   COPD exacerbation (HCC)   CAP (community acquired pneumonia)    Time spent: 15 minutes. Greater than 50% of this time was spent in direct contact with the patient coordinating care.    Pleas Koch, MD [facility] [phone]

## 2015-04-10 NOTE — Progress Notes (Addendum)
Patient on venti mask 40, asleep she received anxiety medicine earlier which she also needed. Respirations 21 saturation 95

## 2015-04-10 NOTE — Progress Notes (Signed)
Subjective: She looks significantly better this morning. She has no new complaints. She is coughing and is requesting something for cough. It appears that when she coughs it sets off episodes of bronchospasm  Objective: Vital signs in last 24 hours: Temp:  [97.5 F (36.4 C)-98.5 F (36.9 C)] 97.9 F (36.6 C) (01/07 0757) Pulse Rate:  [67-136] 136 (01/07 0900) Resp:  [17-29] 29 (01/07 0900) BP: (106-142)/(69-100) 142/79 mmHg (01/07 0900) SpO2:  [89 %-98 %] 89 % (01/07 0900) FiO2 (%):  [40 %] 40 % (01/07 0757) Weight:  [82.5 kg (181 lb 14.1 oz)] 82.5 kg (181 lb 14.1 oz) (01/07 0400) Weight change: -4.4 kg (-9 lb 11.2 oz) Last BM Date: 04/04/15  Intake/Output from previous day: 01/06 0701 - 01/07 0700 In: -  Out: 2650 [Urine:2650]  PHYSICAL EXAM General appearance: alert, cooperative and mild distress Resp: Her chest is clear today. She still has tachypnea but is moving air better than yesterday Cardio: regular rate and rhythm, S1, S2 normal, no murmur, click, rub or gallop GI: soft, non-tender; bowel sounds normal; no masses,  no organomegaly Extremities: extremities normal, atraumatic, no cyanosis or edema  Lab Results:  Results for orders placed or performed during the hospital encounter of 04/05/15 (from the past 48 hour(s))  Blood gas, arterial     Status: Abnormal   Collection Time: 04/08/15  9:15 PM  Result Value Ref Range   FIO2 40.00    Delivery systems BILEVEL POSITIVE AIRWAY PRESSURE    Inspiratory PAP 16.0    Expiratory PAP 8.0    pH, Arterial 7.403 7.350 - 7.450   pCO2 arterial 51.2 (H) 35.0 - 45.0 mmHg   pO2, Arterial 82.1 80.0 - 100.0 mmHg   Bicarbonate 29.5 (H) 20.0 - 24.0 mEq/L   TCO2 18.1 0 - 100 mmol/L   Acid-Base Excess 6.5 (H) 0.0 - 2.0 mmol/L   O2 Saturation 95.3 %   Collection site RIGHT RADIAL    Drawn by [id number]    Sample type ARTERIAL    Allens test (pass/fail) PASS PASS  Blood gas, arterial     Status: Abnormal   Collection Time: 04/09/15   7:47 AM  Result Value Ref Range   FIO2 40.00    Delivery systems BILEVEL POSITIVE AIRWAY PRESSURE    Inspiratory PAP 16.0    Expiratory PAP 8.0    pH, Arterial 7.424 7.350 - 7.450   pCO2 arterial 48.9 (H) 35.0 - 45.0 mmHg   pO2, Arterial 83.0 80.0 - 100.0 mmHg   Bicarbonate 30.1 (H) 20.0 - 24.0 mEq/L   Acid-Base Excess 7.0 (H) 0.0 - 2.0 mmol/L   O2 Saturation 95.9 %   Collection site RIGHT RADIAL    Drawn by COLLECTED BY RT    Sample type ARTERIAL    Allens test (pass/fail) PASS PASS  Blood gas, arterial     Status: Abnormal   Collection Time: 04/09/15  6:28 PM  Result Value Ref Range   FIO2 31.00    Delivery systems OXYGEN MASK    pH, Arterial 7.492 (H) 7.350 - 7.450   pCO2 arterial 42.6 35.0 - 45.0 mmHg   pO2, Arterial 56.4 (L) 80.0 - 100.0 mmHg   Bicarbonate 31.8 (H) 20.0 - 24.0 mEq/L   Acid-Base Excess 8.5 (H) 0.0 - 2.0 mmol/L   O2 Saturation 89.4 %   Collection site RIGHT RADIAL    Drawn by COLLECTED BY RT    Sample type ARTERIAL    Allens test (pass/fail) PASS PASS  ABGS  Recent Labs  04/08/15 2115  04/09/15 1828  PHART 7.403  < > 7.492*  PO2ART 82.1  < > 56.4*  TCO2 18.1  --   --   HCO3 29.5*  < > 31.8*  < > = values in this interval not displayed. CULTURES Recent Results (from the past 240 hour(s))  Urine culture     Status: None   Collection Time: 04/05/15  5:35 AM  Result Value Ref Range Status   Specimen Description URINE, CATHETERIZED  Final   Special Requests NONE  Final   Culture   Final    NO GROWTH 1 DAY [facility]    Report Status 04/06/2015 FINAL  Final  MRSA PCR Screening     Status: None   Collection Time: 04/05/15 10:00 AM  Result Value Ref Range Status   MRSA by PCR NEGATIVE NEGATIVE Final    Comment:        The GeneXpert MRSA Assay (FDA approved for NASAL specimens only), is one component of a comprehensive MRSA colonization surveillance program. It is not intended to diagnose MRSA infection nor to guide  or monitor treatment for MRSA infections.   Culture, blood (routine x 2) Call MD if unable to obtain prior to antibiotics being given     Status: None   Collection Time: 04/05/15 10:30 AM  Result Value Ref Range Status   Specimen Description BLOOD LEFT ANTECUBITAL  Final   Special Requests BOTTLES DRAWN AEROBIC AND ANAEROBIC 10CC EACH  Final   Culture NO GROWTH 5 DAYS  Final   Report Status 04/10/2015 FINAL  Final  Culture, blood (routine x 2) Call MD if unable to obtain prior to antibiotics being given     Status: None   Collection Time: 04/05/15 10:36 AM  Result Value Ref Range Status   Specimen Description BLOOD RIGHT HAND  Final   Special Requests BOTTLES DRAWN AEROBIC AND ANAEROBIC 10CC EACH  Final   Culture NO GROWTH 5 DAYS  Final   Report Status 04/10/2015 FINAL  Final   Studies/Results: Dg Chest Port 1 View  04/09/2015  CLINICAL DATA:  Respiratory failure. EXAM: PORTABLE CHEST 1 VIEW COMPARISON:  04/07/2015 . FINDINGS: Mediastinum and hilar structures normal. Lungs are clear. Stable mild cardiomegaly. No pulmonary venous congestion . No pleural effusion or pneumothorax. IMPRESSION: 1. Stable mild cardiomegaly.  No pulmonary venous congestion. 2. No acute pulmonary disease . Electronically Signed   By: Maisie Fushomas  Register   On: 04/09/2015 09:13   Dg Chest Port 1v Same Day  04/09/2015  CLINICAL DATA:  Increased shortness of breath. History of SVT, COPD. EXAM: PORTABLE CHEST 1 VIEW COMPARISON:  Chest x-rays dated 04/09/2015, 04/07/2015 and 05/30/2011. FINDINGS: Heart size is normal. Overall cardiomediastinal silhouette is stable in size and configuration. 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 No confluent airspace opacity to suggest a developing pneumonia. No pleural effusion seen. Osseous structures about the chest are unremarkable. IMPRESSION: 1. COPD/emphysema. 2. No evidence of acute cardiopulmonary abnormality. Electronically Signed   By: Bary RichardStan  Maynard M.D.    On: 04/09/2015 18:58    Medications:  Prior to Admission:  Prescriptions prior to admission  Medication Sig Dispense Refill Last Dose  . albuterol (PROAIR HFA) 108 (90 BASE) MCG/ACT inhaler Inhale 2 puffs into the lungs every 6 (six) hours as needed for wheezing or shortness of breath.   Past Week at Unknown time  . aspirin EC 81 MG tablet Take 81 mg by mouth daily.  04/04/2015 at Unknown time  . atorvastatin (LIPITOR) 10 MG tablet Take 10 mg by mouth daily.   04/04/2015 at Unknown time  . calcium carbonate (TUMS - DOSED IN MG ELEMENTAL CALCIUM) 500 MG chewable tablet Chew 1 tablet by mouth as needed.   Past Week at Unknown time  . diltiazem (CARDIZEM CD) 300 MG 24 hr capsule Take 1 capsule (300 mg total) by mouth daily. 30 capsule 11 04/04/2015 at Unknown time  . famotidine (PEPCID) 10 MG tablet Take 10 mg by mouth 2 (two) times daily.   Past Week at Unknown time  . furosemide (LASIX) 20 MG tablet Take 1 tab daily as needed for swelling 90 tablet 3 Past Week at Unknown time  . ipratropium-albuterol (DUONEB) 0.5-2.5 (3) MG/3ML SOLN Take 3 mLs by nebulization 4 (four) times daily.    Past Week at Unknown time  . Multiple Vitamin (MULTIVITAMIN) tablet Take 1 tablet by mouth daily.   04/04/2015 at Unknown time  . traZODone (DESYREL) 100 MG tablet Take 100 mg by mouth at bedtime.   04/04/2015 at Unknown time   Scheduled: . aspirin EC  81 mg Oral Daily  . atorvastatin  10 mg Oral Daily  . azithromycin  500 mg Oral Daily  . diltiazem  300 mg Oral Daily  . enoxaparin (LOVENOX) injection  40 mg Subcutaneous Q24H  . famotidine  10 mg Oral BID  . furosemide  40 mg Oral BID  . ipratropium-albuterol  3 mL Nebulization Q6H  . methylPREDNISolone (SOLU-MEDROL) injection  80 mg Intravenous Q6H  . multivitamin with minerals  1 tablet Oral Daily  . sodium chloride  3 mL Intravenous Q12H  . traZODone  100 mg Oral QHS   Continuous:  ZOX:WRUEAV chloride, acetaminophen **OR** acetaminophen, albuterol,  benzonatate, HYDROcodone-homatropine, LORazepam, morphine CONCENTRATE, ondansetron **OR** ondansetron (ZOFRAN) IV, polyethylene glycol, senna-docusate, sodium chloride  Assesment: She has acute on chronic respiratory failure. She has community-acquired pneumonia. She has COPD at baseline. She is improving. This has been slow. She is off BiPAP. Principal Problem:   Acute on chronic respiratory failure (HCC) Active Problems:   COPD exacerbation (HCC)   CAP (community acquired pneumonia)    Plan: Continue current treatments. Add something for cough. Continue IV steroids    LOS: 5 days   Talasia Saulter L 04/10/2015, 11:10 AM

## 2015-04-11 ENCOUNTER — Inpatient Hospital Stay (HOSPITAL_COMMUNITY): Payer: Medicare HMO

## 2015-04-11 LAB — BASIC METABOLIC PANEL
Anion gap: 9 (ref 5–15)
BUN: 31 mg/dL — AB (ref 6–20)
CALCIUM: 8.6 mg/dL — AB (ref 8.9–10.3)
CO2: 33 mmol/L — AB (ref 22–32)
CREATININE: 0.76 mg/dL (ref 0.44–1.00)
Chloride: 99 mmol/L — ABNORMAL LOW (ref 101–111)
GFR calc non Af Amer: >60 mL/min (ref >60)
GLUCOSE: 168 mg/dL — AB (ref 65–99)
Potassium: 3.8 mmol/L (ref 3.5–5.1)
Sodium: 141 mmol/L (ref 135–145)

## 2015-04-11 MED ORDER — IOHEXOL 350 MG/ML SOLN
100.0000 mL | Freq: Once | INTRAVENOUS | Status: AC | PRN
  Administered 2015-04-11: 100 mL via INTRAVENOUS

## 2015-04-11 MED ORDER — BISACODYL 5 MG PO TBEC
10.0000 mg | DELAYED_RELEASE_TABLET | Freq: Once | ORAL | Status: AC
  Administered 2015-04-11: 10 mg via ORAL
  Filled 2015-04-11: qty 2

## 2015-04-11 MED ORDER — FUROSEMIDE 40 MG PO TABS
40.0000 mg | ORAL_TABLET | Freq: Every day | ORAL | Status: DC
  Administered 2015-04-11 – 2015-04-16 (×6): 40 mg via ORAL
  Filled 2015-04-11 (×6): qty 1

## 2015-04-11 NOTE — Progress Notes (Signed)
[facility] PROGRESS NOTE  Margaret Stokes ZOX:[id number] DOB: Mar 14, 1951 DOA: 04/05/2015 PCP: Briscoe Burns, PA-C  64 ? 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 Edema? Vascular congestion + COPD findings Pulmonology consult  Subjective  Waxing and waning breathing and appears worse this am accessory muscles in use   Assessment/Plan:  Acute on chronic respiratory failure 2/2 to decompnesated emphysema/COPD -Waxing and waning-worsened overnight 1/5-1/6 am, -Transition back to IV Solu-Medrol from prednisone 04/09/15--expect very slow taper on steroids which we will try tomorrow 04/12/15 -CT chest performed 1/8 and neg for PE or any effusion -added low dose roxanol 0.25 for dyspnea -baseline functional state is diminished -Continue albuterol 2.5 every 2 when necessary, urinary in addition 3 meals every 6 scheduled -lasix IV added 1/6 with I/o now net -5.8 and transitioned back to PO lasix 40 OD  Acute superimposed on Copd exacerbation -hasn't been febrile so unlikely PNA -CT refutes PNA -All culture data remains negative to date. -wean off Rocephin 2 azithromycin monotherapy at present time -There was some concern of possible bacteriuria however her urine cultures negative so no need for Rocephin coverage.  Acute on chronic diastolic CHF -Echo in November 2015 shows normal ejection fraction with grade 1 diastolic dysfunction.  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 Monitor on telemetry currently on Cardizem 300 daily which controls her HR Re-consider AC per Cardiology as OP  acute kidney injury Probably from Lasix  use, transitioned to by mouth 04/07/15  hyperlipidemia Continue atorvastatin 10 daily  clamp Foley and discontinue  Code Status: Full code Family Communication: Patient only  Disposition Plan: hopefully   Consultants:  Pulmonary   Antibiotics:  Rocephin  Azithromycin   Subjective:  Coughing and sputum this am But improved by pm On Nasal canula No cp Some sputum earlier today No other issues    Objective: Filed Vitals:   04/11/15 0900 04/11/15 1000 04/11/15 1400 04/11/15 1440  BP: 98/82 122/79 98/67   Pulse: 97 109 77   Temp:      TempSrc:      Resp: 39 27 19   Height:      Weight:      SpO2: 93% 90% 91% 91%    Intake/Output Summary (Last 24 hours) at 04/11/15 1519 Last data filed at 04/11/15 1043  Gross per 24 hour  Intake    240 ml  Output   1750 ml  Net  -1510 ml   Filed Weights   04/09/15 0500 04/10/15 0400 04/11/15 0500  Weight: 86.9 kg (191 lb 9.3 oz) 82.5 kg (181 lb 14.1 oz) 84.7 kg (186 lb 11.7 oz)    Exam:   General:  Alert, awake, oriented 3  Cardiovascular: Tachycardic  Respiratory: BS improved slightly  Abdomen: Soft, nontender, nondistended  Extremities: 1+ pitting edema bilaterally   Neurologic:  Grossly intact and nonfocal  Data Reviewed: Basic Metabolic Panel:  Recent Labs Lab 04/05/15 0516 04/05/15 1030 04/06/15 0451 04/07/15 0512 04/08/15 0442 04/11/15 0446  NA 143  --  141 142 144 141  K 4.0  --  3.9 4.2 4.1 3.8  CL 104  --  104 102 105 99*  CO2 29  --  28 31 31  33*  GLUCOSE 121*  --  178* 165* 127* 168*  BUN 16  --  26* 34* 29* 31*  CREATININE 0.86 0.80 0.81 0.87 0.71 0.76  CALCIUM 8.8*  --  8.7* 8.7* 8.6* 8.6*   Liver Function Tests:  Recent Labs Lab 04/08/15 0442  AST 25  ALT 28  ALKPHOS 65  BILITOT 0.4  PROT 5.9*  ALBUMIN 3.3*   No results for input(s): LIPASE, AMYLASE in the last 168 hours. No results for input(s): AMMONIA in the last 168 hours. CBC:  Recent Labs Lab 04/05/15 0516  04/05/15 1030 04/06/15 0451 04/07/15 0512 04/08/15 0442  WBC 9.0 6.9 4.8 9.5 10.2  NEUTROABS 6.5  --   --   --  8.0*  HGB 14.8 14.1 13.4 13.1 13.0  HCT 44.6 43.3 41.2 39.5 40.0  MCV 96.3 96.4 96.0 96.1 96.6  PLT 193 183 188 201 198   Cardiac Enzymes: No results for input(s): CKTOTAL, CKMB, CKMBINDEX, TROPONINI in the last 168 hours. BNP (last 3 results)  Recent Labs  04/07/15 0512  BNP 26.0    ProBNP (last 3 results) No results for input(s): PROBNP in the last 8760 hours.  CBG: No results for input(s): GLUCAP in the last 168 hours.  Recent Results (from the past 240 hour(s))  Urine culture     Status: None   Collection Time: 04/05/15  5:35 AM  Result Value Ref Range Status   Specimen Description URINE, CATHETERIZED  Final   Special Requests NONE  Final   Culture   Final    NO GROWTH 1 DAY [facility] at [facility]    Report Status 04/06/2015 FINAL  Final  MRSA PCR Screening     Status: None   Collection Time: 04/05/15 10:00 AM  Result Value Ref Range Status   MRSA by PCR NEGATIVE NEGATIVE Final    Comment:        The GeneXpert MRSA Assay (FDA approved for NASAL specimens only), is one component of a comprehensive MRSA colonization surveillance program. It is not intended to diagnose MRSA infection nor to guide or monitor treatment for MRSA infections.   Culture, blood (routine x 2) Call MD if unable to obtain prior to antibiotics being given     Status: None   Collection Time: 04/05/15 10:30 AM  Result Value Ref Range Status   Specimen Description BLOOD LEFT ANTECUBITAL  Final   Special Requests BOTTLES DRAWN AEROBIC AND ANAEROBIC 10CC EACH  Final   Culture NO GROWTH 5 DAYS  Final   Report Status 04/10/2015 FINAL  Final  Culture, blood (routine x 2) Call MD if unable to obtain prior to antibiotics being given     Status: None   Collection Time: 04/05/15 10:36 AM  Result Value Ref Range Status   Specimen Description BLOOD RIGHT HAND  Final    Special Requests BOTTLES DRAWN AEROBIC AND ANAEROBIC 10CC EACH  Final   Culture NO GROWTH 5 DAYS  Final   Report Status 04/10/2015 FINAL  Final     Studies: Ct Angio Chest Pe W/cm &/or Wo Cm  04/11/2015  CLINICAL DATA:  Increased shortness of breath. Difficulty breathing. EXAM: CT ANGIOGRAPHY CHEST WITH CONTRAST TECHNIQUE: Multidetector CT imaging of the chest was performed using the standard protocol during bolus administration of intravenous contrast. Multiplanar CT image reconstructions and MIPs were obtained to evaluate the vascular anatomy. CONTRAST:  100mL OMNIPAQUE IOHEXOL 350 MG/ML SOLN COMPARISON:  01/01/2009 FINDINGS: Mediastinum: The heart size appears normal. There is no  pericardial effusion identified. The trachea appears patent and is midline. Moderate hiatal hernia. No mediastinal or hilar adenopathy. No supraclavicular or axillary adenopathy. No abnormal filling defects identified within the main pulmonary artery or its branches to suggest a clinically significant acute pulmonary embolus Lungs/Pleura: No pleural fluid identified. Moderate changes of centrilobular and paraseptal emphysema. No airspace consolidation, atelectasis or interstitial edema. Upper Abdomen: No focal liver abnormality identified. The visualized portions of the spleen and pancreas are normal. The adrenal glands are unremarkable. Musculoskeletal: No aggressive lytic or sclerotic bone lesions identified. Review of the MIP images confirms the above findings. IMPRESSION: 1. No acute pulmonary embolus. 2. Emphysema. 3. Hiatal hernia Electronically Signed   By: Signa Kell M.D.   On: 04/11/2015 11:44   Dg Chest Port 1v Same Day  04/09/2015  CLINICAL DATA:  Increased shortness of breath. History of SVT, COPD. EXAM: PORTABLE CHEST 1 VIEW COMPARISON:  Chest x-rays dated 04/09/2015, 04/07/2015 and 05/30/2011. FINDINGS: Heart size is normal. Overall cardiomediastinal silhouette is stable in size and configuration. Lungs are  again hyperexpanded indicating underlying COPD/emphysema. No confluent airspace opacity to suggest a developing pneumonia. No pleural effusion seen. Osseous structures about the chest are unremarkable. IMPRESSION: 1. COPD/emphysema. 2. No evidence of acute cardiopulmonary abnormality. Electronically Signed   By: Bary Richard M.D.   On: 04/09/2015 18:58    Scheduled Meds: . aspirin EC  81 mg Oral Daily  . atorvastatin  10 mg Oral Daily  . diltiazem  300 mg Oral Daily  . enoxaparin (LOVENOX) injection  40 mg Subcutaneous Q24H  . famotidine  10 mg Oral BID  . furosemide  40 mg Oral Daily  . ipratropium-albuterol  3 mL Nebulization Q6H  . methylPREDNISolone (SOLU-MEDROL) injection  80 mg Intravenous Q6H  . multivitamin with minerals  1 tablet Oral Daily  . sodium chloride  3 mL Intravenous Q12H  . traZODone  100 mg Oral QHS   Continuous Infusions:   Principal Problem:   Acute on chronic respiratory failure (HCC) Active Problems:   COPD exacerbation (HCC)   CAP (community acquired pneumonia)    Time spent: 15 minutes. Greater than 50% of this time was spent in direct contact with the patient coordinating care.    Pleas Koch, MD [facility] [phone]

## 2015-04-11 NOTE — Progress Notes (Signed)
Subjective: She says she doesn't feel as well today. She says she thinks she has pneumonia. She had been on antibiotics but these were discontinued yesterday. She is still on IV steroids.  Objective: Vital signs in last 24 hours: Temp:  [97.5 F (36.4 C)-98.3 F (36.8 C)] 98 F (36.7 C) (01/08 0400) Pulse Rate:  [73-108] 97 (01/08 0900) Resp:  [17-39] 39 (01/08 0900) BP: (96-117)/(61-82) 98/82 mmHg (01/08 0900) SpO2:  [89 %-96 %] 93 % (01/08 0900) FiO2 (%):  [40 %] 40 % (01/07 1459) Weight:  [84.7 kg (186 lb 11.7 oz)] 84.7 kg (186 lb 11.7 oz) (01/08 0500) Weight change: 2.2 kg (4 lb 13.6 oz) Last BM Date: 04/04/15  Intake/Output from previous day: 01/07 0701 - 01/08 0700 In: 720 [P.O.:720] Out: 1900 [Urine:1900]  PHYSICAL EXAM General appearance: alert and mild distress Resp: Decreased breath sounds and prolonged expiratory phase Cardio: She is tachycardic but her heart rate is regular GI: soft, non-tender; bowel sounds normal; no masses,  no organomegaly Extremities: extremities normal, atraumatic, no cyanosis or edema  Lab Results:  Results for orders placed or performed during the hospital encounter of 04/05/15 (from the past 48 hour(s))  Blood gas, arterial     Status: Abnormal   Collection Time: 04/09/15  6:28 PM  Result Value Ref Range   FIO2 31.00    Delivery systems OXYGEN MASK    pH, Arterial 7.492 (H) 7.350 - 7.450   pCO2 arterial 42.6 35.0 - 45.0 mmHg   pO2, Arterial 56.4 (L) 80.0 - 100.0 mmHg   Bicarbonate 31.8 (H) 20.0 - 24.0 mEq/L   Acid-Base Excess 8.5 (H) 0.0 - 2.0 mmol/L   O2 Saturation 89.4 %   Collection site RIGHT RADIAL    Drawn by COLLECTED BY RT    Sample type ARTERIAL    Allens test (pass/fail) PASS PASS  Basic metabolic panel     Status: Abnormal   Collection Time: 04/11/15  4:46 AM  Result Value Ref Range   Sodium 141 135 - 145 mmol/L   Potassium 3.8 3.5 - 5.1 mmol/L   Chloride 99 (L) 101 - 111 mmol/L   CO2 33 (H) 22 - 32 mmol/L   Glucose, Bld 168 (H) 65 - 99 mg/dL   BUN 31 (H) 6 - 20 mg/dL   Creatinine, Ser 0.76 0.44 - 1.00 mg/dL   Calcium 8.6 (L) 8.9 - 10.3 mg/dL   GFR calc non Af Amer >60 >60 mL/min   GFR calc Af Amer >60 >60 mL/min    Comment: (NOTE) The eGFR has been calculated using the CKD EPI equation. This calculation has not been validated in all clinical situations. eGFR's persistently <60 mL/min signify possible Chronic Kidney Disease.    Anion gap 9 5 - 15    ABGS  Recent Labs  04/08/15 2115  04/09/15 1828  PHART 7.403  < > 7.492*  PO2ART 82.1  < > 56.4*  TCO2 18.1  --   --   HCO3 29.5*  < > 31.8*  < > = values in this interval not displayed. CULTURES Recent Results (from the past 240 hour(s))  Urine culture     Status: None   Collection Time: 04/05/15  5:35 AM  Result Value Ref Range Status   Specimen Description URINE, CATHETERIZED  Final   Special Requests NONE  Final   Culture   Final    NO GROWTH 1 DAY Performed at [facility]    Report Status 04/06/2015 FINAL  Final  MRSA PCR Screening     Status: None   Collection Time: 04/05/15 10:00 AM  Result Value Ref Range Status   MRSA by PCR NEGATIVE NEGATIVE Final    Comment:        The GeneXpert MRSA Assay (FDA approved for NASAL specimens only), is one component of a comprehensive MRSA colonization surveillance program. It is not intended to diagnose MRSA infection nor to guide or monitor treatment for MRSA infections.   Culture, blood (routine x 2) Call MD if unable to obtain prior to antibiotics being given     Status: None   Collection Time: 04/05/15 10:30 AM  Result Value Ref Range Status   Specimen Description BLOOD LEFT ANTECUBITAL  Final   Special Requests BOTTLES DRAWN AEROBIC AND ANAEROBIC 10CC EACH  Final   Culture NO GROWTH 5 DAYS  Final   Report Status 04/10/2015 FINAL  Final  Culture, blood (routine x 2) Call MD if unable to obtain prior to antibiotics being given     Status: None   Collection Time:  04/05/15 10:36 AM  Result Value Ref Range Status   Specimen Description BLOOD RIGHT HAND  Final   Special Requests BOTTLES DRAWN AEROBIC AND ANAEROBIC 10CC EACH  Final   Culture NO GROWTH 5 DAYS  Final   Report Status 04/10/2015 FINAL  Final   Studies/Results: Dg Chest Port 1v Same Day  04/09/2015  CLINICAL DATA:  Increased shortness of breath. History of SVT, COPD. EXAM: PORTABLE CHEST 1 VIEW COMPARISON:  Chest x-rays dated 04/09/2015, 04/07/2015 and 05/30/2011. FINDINGS: Heart size is normal. Overall cardiomediastinal silhouette is stable in size and configuration. 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 No confluent airspace opacity to suggest a developing pneumonia. No pleural effusion seen. Osseous structures about the chest are unremarkable. IMPRESSION: 1. COPD/emphysema. 2. No evidence of acute cardiopulmonary abnormality. Electronically Signed   By: Franki Cabot M.D.   On: 04/09/2015 18:58    Medications:  Prior to Admission:  Prescriptions prior to admission  Medication Sig Dispense Refill Last Dose  . albuterol (PROAIR HFA) 108 (90 BASE) MCG/ACT inhaler Inhale 2 puffs into the lungs every 6 (six) hours as needed for wheezing or shortness of breath.   Past Week at Unknown time  . aspirin EC 81 MG tablet Take 81 mg by mouth daily.   04/04/2015 at Unknown time  . atorvastatin (LIPITOR) 10 MG tablet Take 10 mg by mouth daily.   04/04/2015 at Unknown time  . calcium carbonate (TUMS - DOSED IN MG ELEMENTAL CALCIUM) 500 MG chewable tablet Chew 1 tablet by mouth as needed.   Past Week at Unknown time  . diltiazem (CARDIZEM CD) 300 MG 24 hr capsule Take 1 capsule (300 mg total) by mouth daily. 30 capsule 11 04/04/2015 at Unknown time  . famotidine (PEPCID) 10 MG tablet Take 10 mg by mouth 2 (two) times daily.   Past Week at Unknown time  . furosemide (LASIX) 20 MG tablet Take 1 tab daily as needed for swelling 90 tablet 3 Past Week at Unknown time  .  ipratropium-albuterol (DUONEB) 0.5-2.5 (3) MG/3ML SOLN Take 3 mLs by nebulization 4 (four) times daily.    Past Week at Unknown time  . Multiple Vitamin (MULTIVITAMIN) tablet Take 1 tablet by mouth daily.   04/04/2015 at Unknown time  . traZODone (DESYREL) 100 MG tablet Take 100 mg by mouth at bedtime.   04/04/2015 at Unknown time   Scheduled: . aspirin EC  81  mg Oral Daily  . atorvastatin  10 mg Oral Daily  . diltiazem  300 mg Oral Daily  . enoxaparin (LOVENOX) injection  40 mg Subcutaneous Q24H  . famotidine  10 mg Oral BID  . furosemide  40 mg Oral Daily  . ipratropium-albuterol  3 mL Nebulization Q6H  . methylPREDNISolone (SOLU-MEDROL) injection  80 mg Intravenous Q6H  . multivitamin with minerals  1 tablet Oral Daily  . sodium chloride  3 mL Intravenous Q12H  . traZODone  100 mg Oral QHS   Continuous:  JEH:UDJSHF chloride, acetaminophen **OR** acetaminophen, albuterol, benzonatate, HYDROcodone-homatropine, LORazepam, morphine CONCENTRATE, ondansetron **OR** ondansetron (ZOFRAN) IV, polyethylene glycol, senna-docusate, sodium chloride  Assesment: She has acute on chronic respiratory failure with COPD exacerbation. She has been treated for community-acquired pneumonia. She says she feels like she is coughing up some sputum. Chest x-ray done on the sixth really did not show an infiltrate. She has been waxing and waning. Principal Problem:   Acute on chronic respiratory failure (HCC) Active Problems:   COPD exacerbation ([place])   CAP (community acquired pneumonia)    Plan: Continue IV steroids. I think we should get a CT angiogram which should help [place] to decide whether she has pneumonia in addition to making sure she doesn't have pulmonary emboli.    LOS: 6 days   Margaret Stokes L 04/11/2015, 9:58 AM

## 2015-04-11 NOTE — Progress Notes (Signed)
eLink Physician-Brief Progress Note Patient Name: Margaret Stokes DOB: 10/28/1950 MRN: [id number]   Date of Service  04/11/2015  HPI/Events of Note  Increased wob, tachy , desatn  eICU Interventions  bipap prn     Intervention Category Intermediate Interventions: Respiratory distress - evaluation and management  Abimael Zeiter V. 04/11/2015, 9:02 PM

## 2015-04-11 NOTE — Progress Notes (Signed)
Hr 140 -112 monitor , pulse 83 before and after neb treatment. Patient exhibiting no symptoms. Still has cough.

## 2015-04-12 DIAGNOSIS — J9600 Acute respiratory failure, unspecified whether with hypoxia or hypercapnia: Secondary | ICD-10-CM | POA: Insufficient documentation

## 2015-04-12 DIAGNOSIS — J441 Chronic obstructive pulmonary disease with (acute) exacerbation: Secondary | ICD-10-CM | POA: Diagnosis present

## 2015-04-12 LAB — BASIC METABOLIC PANEL
ANION GAP: 9 (ref 5–15)
BUN: 35 mg/dL — ABNORMAL HIGH (ref 6–20)
CO2: 33 mmol/L — ABNORMAL HIGH (ref 22–32)
Calcium: 8.4 mg/dL — ABNORMAL LOW (ref 8.9–10.3)
Chloride: 98 mmol/L — ABNORMAL LOW (ref 101–111)
Creatinine, Ser: 0.8 mg/dL (ref 0.44–1.00)
GFR calc Af Amer: >60 mL/min (ref >60)
GLUCOSE: 171 mg/dL — AB (ref 65–99)
POTASSIUM: 3.8 mmol/L (ref 3.5–5.1)
Sodium: 140 mmol/L (ref 135–145)

## 2015-04-12 MED ORDER — BISACODYL 5 MG PO TBEC
10.0000 mg | DELAYED_RELEASE_TABLET | Freq: Once | ORAL | Status: AC
  Administered 2015-04-12: 10 mg via ORAL
  Filled 2015-04-12: qty 2

## 2015-04-12 MED ORDER — BUDESONIDE-FORMOTEROL FUMARATE 160-4.5 MCG/ACT IN AERO
2.0000 | INHALATION_SPRAY | Freq: Two times a day (BID) | RESPIRATORY_TRACT | Status: DC
  Administered 2015-04-12 – 2015-04-16 (×9): 2 via RESPIRATORY_TRACT
  Filled 2015-04-12: qty 6

## 2015-04-12 NOTE — Care Management Note (Signed)
Case Management Note  Patient Details  Name: Margaret Stokes MRN: [id number] Date of Birth: 10/21/1950  Subjective/Objective:                  Pt is from home, lives with her husband and says she is very ind at baseline. Pt requires no assistive devices nor does she have home O2 at home. Pt has neb machine at home. MD anticipates pt will need home O2 and Bipap. [facility].   Action/Plan: Alroy BailiffLinda Lothian, of [facility], made aware of referral and will obtain pt info from chart and cont to follow. Pt will need home O2 assessment and PT eval priot to DC. May need SNF vs HH at DC. Will cont to follow.   Expected Discharge Date:     04/16/2015             Expected Discharge Plan:  Home w Home Health Services  In-House Referral:  NA  Discharge planning Services  CM Consult  Post Acute Care Choice:  NA Choice offered to:  NA  DME Arranged:  Oxygen, Bipap DME Agency:  [facility]  HH Arranged:    HH Agency:     Status of Service:  In process, will continue to follow  Medicare Important Message Given:    Date Medicare IM Given:    Medicare IM give by:    Date Additional Medicare IM Given:    Additional Medicare Important Message give by:     If discussed at Long Length of Stay Meetings, dates discussed:    Additional Comments:  Malcolm MetroChildress, Roland Prine Demske, RN 04/12/2015, 4:04 PM

## 2015-04-12 NOTE — Progress Notes (Signed)
[facility] PROGRESS NOTE  Margaret Stokes ZOX:[id number] DOB: 1950-07-30 DOA: 04/05/2015 PCP: Briscoe Burns, PA-C  64 ? 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 Edema? Vascular congestion + COPD findings Pulmonology consult  Assessment/Plan:  Acute on chronic respiratory failure 2/2 to decompnesated emphysema/COPD -Waxing and waning-worsened overnight 1/5-1/6 am, -Transition back to IV Solu-Medrol from prednisone 04/09/15--expect very slow taper on steroids which we will try tomorrow 04/12/15 -CT chest performed 1/8 and neg for PE or any effusion - low dose roxanol 0.25 for dyspnea -baseline functional state is diminished -Continue albuterol 2.5 every 2 when necessary -lasix IV added 1/6 with I/o now net -5.8 and transitioned back to PO lasix 40 OD  Acute superimposed on Copd exacerbation -hasn't been febrile so unlikely PNA -CT refutes PNA -All culture data remains negative to date. -wean off Rocephin + azithromycin compeltely on 1/7  -There was some concern of possible bacteriuria however her urine cultures negative   Acute on chronic diastolic CHF -Echo in November 2015 shows normal ejection fraction with grade 1 diastolic dysfunction.  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 WOB No cp No n/v eating some     Objective: Filed Vitals:   04/12/15 0900 04/12/15 1000 04/12/15 1200 04/12/15 1305  BP:  132/68 129/96   Pulse: 115 101 103   Temp: 96.6 F (35.9 C)     TempSrc:      Resp: 29 36 22   Height:      Weight:      SpO2: 96% 92% 90% 93%    Intake/Output Summary (Last 24 hours) at 04/12/15 1452 Last data filed at 04/12/15 1300  Gross per 24 hour  Intake   1200 ml  Output   1125 ml  Net     75 ml   Filed Weights   04/10/15 0400 04/11/15 0500 04/12/15 0626  Weight: 82.5 kg (181 lb 14.1 oz) 84.7 kg (186 lb 11.7 oz) 84.6 kg (186 lb 8.2 oz)    Exam:   General:  Alert, awake, oriented 3  Cardiovascular: Tachycardic  Respiratory: BS improved slightly, no rale snor rhonchi  Abdomen: Soft, nontender, nondistended  Extremities: trace LE edema bilaterally   Neurologic:  Grossly intact and nonfocal  Data Reviewed: Basic Metabolic Panel:  Recent Labs Lab 04/06/15 0451 04/07/15 0512 04/08/15 0442 04/11/15 0446 04/12/15 0428  NA 141 142 144 141 140  K 3.9 4.2 4.1 3.8 3.8  CL 104 102 105 99* 98*  CO2 28 31 31  33* 33*  GLUCOSE 178* 165* 127* 168* 171*  BUN 26* 34* 29* 31* 35*  CREATININE 0.81 0.87 0.71 0.76 0.80  CALCIUM 8.7* 8.7* 8.6* 8.6* 8.4*   Liver Function Tests:  Recent Labs Lab 04/08/15 0442  AST  25  ALT 28  ALKPHOS 65  BILITOT 0.4  PROT 5.9*  ALBUMIN 3.3*   No results for input(s): LIPASE, AMYLASE in the last 168 hours. No results for input(s): AMMONIA in the last 168 hours. CBC:  Recent Labs Lab 04/06/15 0451 04/07/15 0512 04/08/15 0442  WBC 4.8 9.5 10.2  NEUTROABS  --   --  8.0*  HGB 13.4 13.1 13.0  HCT 41.2 39.5 40.0  MCV 96.0 96.1 96.6  PLT 188 201 198   Cardiac Enzymes: No results for input(s): CKTOTAL, CKMB, CKMBINDEX, TROPONINI in the last  168 hours. BNP (last 3 results)  Recent Labs  04/07/15 0512  BNP 26.0    ProBNP (last 3 results) No results for input(s): PROBNP in the last 8760 hours.  CBG: No results for input(s): GLUCAP in the last 168 hours.  Recent Results (from the past 240 hour(s))  Urine culture     Status: None   Collection Time: 04/05/15  5:35 AM  Result Value Ref Range Status   Specimen Description URINE, CATHETERIZED  Final   Special Requests NONE  Final   Culture   Final    NO GROWTH 1 DAY Performed at [facility]    Report Status 04/06/2015 FINAL  Final  MRSA PCR Screening     Status: None   Collection Time: 04/05/15 10:00 AM  Result Value Ref Range Status   MRSA by PCR NEGATIVE NEGATIVE Final    Comment:        The GeneXpert MRSA Assay (FDA approved for NASAL specimens only), is one component of a comprehensive MRSA colonization surveillance program. It is not intended to diagnose MRSA infection nor to guide or monitor treatment for MRSA infections.   Culture, blood (routine x 2) Call MD if unable to obtain prior to antibiotics being given     Status: None   Collection Time: 04/05/15 10:30 AM  Result Value Ref Range Status   Specimen Description BLOOD LEFT ANTECUBITAL  Final   Special Requests BOTTLES DRAWN AEROBIC AND ANAEROBIC 10CC EACH  Final   Culture NO GROWTH 5 DAYS  Final   Report Status 04/10/2015 FINAL  Final  Culture, blood (routine x 2) Call MD if unable to obtain prior to antibiotics being given     Status: None   Collection Time: 04/05/15 10:36 AM  Result Value Ref Range Status   Specimen Description BLOOD RIGHT HAND  Final   Special Requests BOTTLES DRAWN AEROBIC AND ANAEROBIC 10CC EACH  Final   Culture NO GROWTH 5 DAYS  Final   Report Status 04/10/2015 FINAL  Final     Studies: Ct Angio Chest Pe W/cm &/or Wo Cm  04/11/2015  CLINICAL DATA:  Increased shortness of breath. Difficulty breathing. EXAM: CT ANGIOGRAPHY CHEST WITH CONTRAST TECHNIQUE:  Multidetector CT imaging of the chest was performed using the standard protocol during bolus administration of intravenous contrast. Multiplanar CT image reconstructions and MIPs were obtained to evaluate the vascular anatomy. CONTRAST:  OMNIPAQUE IOHEXOL 350 MG/ML SOLN COMPARISON:  01/01/2009 FINDINGS: Mediastinum: The heart size appears normal. There is no pericardial effusion identified. The trachea appears patent and is midline. Moderate hiatal hernia. No mediastinal or hilar adenopathy. No supraclavicular or axillary adenopathy. No abnormal filling defects identified within the main pulmonary artery or its branches to suggest a clinically significant acute pulmonary embolus Lungs/Pleura: No pleural fluid identified. Moderate changes of centrilobular and paraseptal emphysema. No airspace consolidation, atelectasis or interstitial edema. Upper Abdomen: No focal liver  abnormality identified. The visualized portions of the spleen and pancreas are normal. The adrenal glands are unremarkable. Musculoskeletal: No aggressive lytic or sclerotic bone lesions identified. Review of the MIP images confirms the above findings. IMPRESSION: 1. No acute pulmonary embolus. 2. Emphysema. 3. Hiatal hernia Electronically Signed   By: Signa Kellaylor  Stroud M.D.   On: 04/11/2015 11:44    Scheduled Meds: . aspirin EC  81 mg Oral Daily  . atorvastatin  10 mg Oral Daily  . bisacodyl  10 mg Oral Once  . budesonide-formoterol  2 puff Inhalation BID  . diltiazem  300 mg Oral Daily  . enoxaparin (LOVENOX) injection  40 mg Subcutaneous Q24H  . famotidine  10 mg Oral BID  . furosemide  40 mg Oral Daily  . ipratropium-albuterol  3 mL Nebulization Q6H  . methylPREDNISolone (SOLU-MEDROL) injection  80 mg Intravenous Q6H  . multivitamin with minerals  1 tablet Oral Daily  . sodium chloride  3 mL Intravenous Q12H  . traZODone  100 mg Oral QHS   Continuous Infusions:   Principal Problem:   Acute on chronic respiratory failure  (HCC) Active Problems:   COPD exacerbation (HCC)   CAP (community acquired pneumonia)    Time spent: 15 minutes. Greater than 50% of this time was spent in direct contact with the patient coordinating care.    Pleas KochJai Madison Albea, MD [facility] [phone]

## 2015-04-12 NOTE — Progress Notes (Signed)
Patient heart rate 130s, monitor ,  pulse 53 patient coughing , wob 28 . Lungs decreased clear.

## 2015-04-12 NOTE — Progress Notes (Signed)
Subjective: She continues to be short of breath even with minimal exertion. Yesterday she had expressed the thought she had pneumonia but CT does not show any evidence of pneumonia. It does show evidence of severe COPD. No blood clot. I explained all that to her this morning again. She is still coughing mostly nonproductively. When she coughs it tends to send her into bronchospasm so she has something available to stop her cough. She has been on BiPAP when necessary she says she is constipated  Objective: Vital signs in last 24 hours: Temp:  [97 F (36.1 C)-97.7 F (36.5 C)] 97.7 F (36.5 C) (01/09 0626) Pulse Rate:  [54-145] 88 (01/09 0626) Resp:  [17-39] 28 (01/09 0626) BP: (95-127)/(65-82) 95/66 mmHg (01/09 0733) SpO2:  [90 %-96 %] 96 % (01/09 0626) Weight:  [84.6 kg (186 lb 8.2 oz)] 84.6 kg (186 lb 8.2 oz) (01/09 0626) Weight change: -0.1 kg (-3.5 oz) Last BM Date: 04/04/15  Intake/Output from previous day: 01/08 0701 - 01/09 0700 In: -  Out: 950 [Urine:950]  PHYSICAL EXAM General appearance: alert and moderate distress Resp: Decreased breath sounds and prolonged expiration bilaterally Cardio: regular rate and rhythm, S1, S2 normal, no murmur, click, rub or gallop GI: soft, non-tender; bowel sounds normal; no masses,  no organomegaly Extremities: extremities normal, atraumatic, no cyanosis or edema  Lab Results:  Results for orders placed or performed during the hospital encounter of 04/05/15 (from the past 48 hour(s))  Basic metabolic panel     Status: Abnormal   Collection Time: 04/11/15  4:46 AM  Result Value Ref Range   Sodium 141 135 - 145 mmol/L   Potassium 3.8 3.5 - 5.1 mmol/L   Chloride 99 (L) 101 - 111 mmol/L   CO2 33 (H) 22 - 32 mmol/L   Glucose, Bld 168 (H) 65 - 99 mg/dL   BUN 31 (H) 6 - 20 mg/dL   Creatinine, Ser 0.76 0.44 - 1.00 mg/dL   Calcium 8.6 (L) 8.9 - 10.3 mg/dL   GFR calc non Af Amer >60 >60 mL/min   GFR calc Af Amer >60 >60 mL/min    Comment:  (NOTE) The eGFR has been calculated using the CKD EPI equation. This calculation has not been validated in all clinical situations. eGFR's persistently <60 mL/min signify possible Chronic Kidney Disease.    Anion gap 9 5 - 15  Basic metabolic panel     Status: Abnormal   Collection Time: 04/12/15  4:28 AM  Result Value Ref Range   Sodium 140 135 - 145 mmol/L   Potassium 3.8 3.5 - 5.1 mmol/L   Chloride 98 (L) 101 - 111 mmol/L   CO2 33 (H) 22 - 32 mmol/L   Glucose, Bld 171 (H) 65 - 99 mg/dL   BUN 35 (H) 6 - 20 mg/dL   Creatinine, Ser 0.80 0.44 - 1.00 mg/dL   Calcium 8.4 (L) 8.9 - 10.3 mg/dL   GFR calc non Af Amer >60 >60 mL/min   GFR calc Af Amer >60 >60 mL/min    Comment: (NOTE) The eGFR has been calculated using the CKD EPI equation. This calculation has not been validated in all clinical situations. eGFR's persistently <60 mL/min signify possible Chronic Kidney Disease.    Anion gap 9 5 - 15    ABGS  Recent Labs  04/09/15 1828  PHART 7.492*  PO2ART 56.4*  HCO3 31.8*   CULTURES Recent Results (from the past 240 hour(s))  Urine culture     Status:  None   Collection Time: 04/05/15  5:35 AM  Result Value Ref Range Status   Specimen Description URINE, CATHETERIZED  Final   Special Requests NONE  Final   Culture   Final    NO GROWTH 1 DAY Performed at [facility]    Report Status 04/06/2015 FINAL  Final  MRSA PCR Screening     Status: None   Collection Time: 04/05/15 10:00 AM  Result Value Ref Range Status   MRSA by PCR NEGATIVE NEGATIVE Final    Comment:        The GeneXpert MRSA Assay (FDA approved for NASAL specimens only), is one component of a comprehensive MRSA colonization surveillance program. It is not intended to diagnose MRSA infection nor to guide or monitor treatment for MRSA infections.   Culture, blood (routine x 2) Call MD if unable to obtain prior to antibiotics being given     Status: None   Collection Time: 04/05/15 10:30 AM   Result Value Ref Range Status   Specimen Description BLOOD LEFT ANTECUBITAL  Final   Special Requests BOTTLES DRAWN AEROBIC AND ANAEROBIC 10CC EACH  Final   Culture NO GROWTH 5 DAYS  Final   Report Status 04/10/2015 FINAL  Final  Culture, blood (routine x 2) Call MD if unable to obtain prior to antibiotics being given     Status: None   Collection Time: 04/05/15 10:36 AM  Result Value Ref Range Status   Specimen Description BLOOD RIGHT HAND  Final   Special Requests BOTTLES DRAWN AEROBIC AND ANAEROBIC 10CC EACH  Final   Culture NO GROWTH 5 DAYS  Final   Report Status 04/10/2015 FINAL  Final   Studies/Results: Ct Angio Chest Pe W/cm &/or Wo Cm  04/11/2015  CLINICAL DATA:  Increased shortness of breath. Difficulty breathing. EXAM: CT ANGIOGRAPHY CHEST WITH CONTRAST TECHNIQUE: Multidetector CT imaging of the chest was performed using the standard protocol during bolus administration of intravenous contrast. Multiplanar CT image reconstructions and MIPs were obtained to evaluate the vascular anatomy. CONTRAST:  138m OMNIPAQUE IOHEXOL 350 MG/ML SOLN COMPARISON:  01/01/2009 FINDINGS: Mediastinum: The heart size appears normal. There is no pericardial effusion identified. The trachea appears patent and is midline. Moderate hiatal hernia. No mediastinal or hilar adenopathy. No supraclavicular or axillary adenopathy. No abnormal filling defects identified within the main pulmonary artery or its branches to suggest a clinically significant acute pulmonary embolus Lungs/Pleura: No pleural fluid identified. Moderate changes of centrilobular and paraseptal emphysema. No airspace consolidation, atelectasis or interstitial edema. Upper Abdomen: No focal liver abnormality identified. The visualized portions of the spleen and pancreas are normal. The adrenal glands are unremarkable. Musculoskeletal: No aggressive lytic or sclerotic bone lesions identified. Review of the MIP images confirms the above findings.  IMPRESSION: 1. No acute pulmonary embolus. 2. Emphysema. 3. Hiatal hernia Electronically Signed   By: TKerby MoorsM.D.   On: 04/11/2015 11:44    Medications:  Prior to Admission:  Prescriptions prior to admission  Medication Sig Dispense Refill Last Dose  . albuterol (PROAIR HFA) 108 (90 BASE) MCG/ACT inhaler Inhale 2 puffs into the lungs every 6 (six) hours as needed for wheezing or shortness of breath.   Past Week at Unknown time  . aspirin EC 81 MG tablet Take 81 mg by mouth daily.   04/04/2015 at Unknown time  . atorvastatin (LIPITOR) 10 MG tablet Take 10 mg by mouth daily.   04/04/2015 at Unknown time  . calcium carbonate (TUMS - DOSED IN  MG ELEMENTAL CALCIUM) 500 MG chewable tablet Chew 1 tablet by mouth as needed.   Past Week at Unknown time  . diltiazem (CARDIZEM CD) 300 MG 24 hr capsule Take 1 capsule (300 mg total) by mouth daily. 30 capsule 11 04/04/2015 at Unknown time  . famotidine (PEPCID) 10 MG tablet Take 10 mg by mouth 2 (two) times daily.   Past Week at Unknown time  . furosemide (LASIX) 20 MG tablet Take 1 tab daily as needed for swelling 90 tablet 3 Past Week at Unknown time  . ipratropium-albuterol (DUONEB) 0.5-2.5 (3) MG/3ML SOLN Take 3 mLs by nebulization 4 (four) times daily.    Past Week at Unknown time  . Multiple Vitamin (MULTIVITAMIN) tablet Take 1 tablet by mouth daily.   04/04/2015 at Unknown time  . traZODone (DESYREL) 100 MG tablet Take 100 mg by mouth at bedtime.   04/04/2015 at Unknown time   Scheduled: . aspirin EC  81 mg Oral Daily  . atorvastatin  10 mg Oral Daily  . budesonide-formoterol  2 puff Inhalation BID  . diltiazem  300 mg Oral Daily  . enoxaparin (LOVENOX) injection  40 mg Subcutaneous Q24H  . famotidine  10 mg Oral BID  . furosemide  40 mg Oral Daily  . ipratropium-albuterol  3 mL Nebulization Q6H  . methylPREDNISolone (SOLU-MEDROL) injection  80 mg Intravenous Q6H  . multivitamin with minerals  1 tablet Oral Daily  . sodium chloride  3 mL  Intravenous Q12H  . traZODone  100 mg Oral QHS   Continuous:  POE:UMPNTI chloride, acetaminophen **OR** acetaminophen, albuterol, benzonatate, HYDROcodone-homatropine, LORazepam, morphine CONCENTRATE, ondansetron **OR** ondansetron (ZOFRAN) IV, polyethylene glycol, senna-docusate, sodium chloride  Assesment: She has acute on chronic respiratory failure and COPD exacerbation. On admission she was thought to have community-acquired pneumonia but I think that was probably atelectasis on chest x-ray. CT does not show any pulmonary emboli or pneumonia but does show severe emphysema. Principal Problem:   Acute on chronic respiratory failure (HCC) Active Problems:   COPD exacerbation ([place])   CAP (community acquired pneumonia)    Plan: Continue treatments. I don't think she needs an antibiotic. I will add Symbicort.    LOS: 7 days   Vlad Mayberry L 04/12/2015, 7:36 AM

## 2015-04-13 MED ORDER — SORBITOL 70 % SOLN
30.0000 mL | Freq: Once | Status: AC
  Administered 2015-04-13: 30 mL via ORAL
  Filled 2015-04-13: qty 30

## 2015-04-13 MED ORDER — BISACODYL 10 MG RE SUPP: 10.0000 mg | Freq: Every day | RECTAL | Status: DC | PRN

## 2015-04-13 MED ORDER — METHYLPREDNISOLONE SODIUM SUCC 125 MG IJ SOLR
60.0000 mg | Freq: Four times a day (QID) | INTRAMUSCULAR | Status: DC
  Administered 2015-04-13 – 2015-04-14 (×3): 60 mg via INTRAVENOUS
  Filled 2015-04-13 (×3): qty 2

## 2015-04-13 MED ORDER — BISACODYL 5 MG PO TBEC
10.0000 mg | DELAYED_RELEASE_TABLET | Freq: Every day | ORAL | Status: DC | PRN
  Administered 2015-04-13 – 2015-04-15 (×2): 10 mg via ORAL
  Filled 2015-04-13 (×3): qty 2

## 2015-04-13 NOTE — Care Management Note (Signed)
Case Management Note  Patient Details  Name: Darci Currenteresa Ann Valletta MRN: [id number] Date of Birth: 07/31/1950   Expected Discharge Date:                  Expected Discharge Plan:  Home w Home Health Services  In-House Referral:  NA  Discharge planning Services  CM Consult  Post Acute Care Choice:  NA Choice offered to:  NA  DME Arranged:  Oxygen, Bipap DME Agency:  [facility]  [facility]     Status of Service:  In process, will continue to follow  Medicare Important Message Given:    Date Medicare IM Given:    Medicare IM give by:    Date Additional Medicare IM Given:    Additional Medicare Important Message give by:     If discussed at Long Length of Stay Meetings, dates discussed:  04/13/2015  Additional Comments:  Malcolm Metrohildress, Aijah Lattner Demske, RN 04/13/2015, 2:16 PM

## 2015-04-13 NOTE — Progress Notes (Addendum)
Subjective: She is better. Her major complaint is that she's not been able to have a bowel movement and when she attempts to have a bowel movement she becomes hypoxic. She did not require BiPAP overnight  Objective: Vital signs in last 24 hours: Temp:  [96.6 F (35.9 C)-97.5 F (36.4 C)] 96.9 F (36.1 C) (01/10 0400) Pulse Rate:  [62-115] 101 (01/10 0600) Resp:  [18-36] 25 (01/10 0600) BP: (94-143)/(64-96) 143/86 mmHg (01/10 0600) SpO2:  [89 %-100 %] 100 % (01/10 0600) Weight:  [83.5 kg (184 lb 1.4 oz)] 83.5 kg (184 lb 1.4 oz) (01/10 0500) Weight change: -1.1 kg (-2 lb 6.8 oz) Last BM Date: 04/04/15  Intake/Output from previous day: 01/09 0701 - 01/10 0700 In: 1803 [P.O.:1800; I.V.:3] Out: 1425 [Urine:1425]  PHYSICAL EXAM General appearance: alert, cooperative and mild distress Resp: She is moving air better but still has decreased breath sounds and prolonged expiratory phase Cardio: regular rate and rhythm, S1, S2 normal, no murmur, click, rub or gallop GI: soft, non-tender; bowel sounds normal; no masses,  no organomegaly Extremities: extremities normal, atraumatic, no cyanosis or edema  Lab Results:  Results for orders placed or performed during the hospital encounter of 04/05/15 (from the past 48 hour(s))  Basic metabolic panel     Status: Abnormal   Collection Time: 04/12/15  4:28 AM  Result Value Ref Range   Sodium 140 135 - 145 mmol/L   Potassium 3.8 3.5 - 5.1 mmol/L   Chloride 98 (L) 101 - 111 mmol/L   CO2 33 (H) 22 - 32 mmol/L   Glucose, Bld 171 (H) 65 - 99 mg/dL   BUN 35 (H) 6 - 20 mg/dL   Creatinine, Ser 0.80 0.44 - 1.00 mg/dL   Calcium 8.4 (L) 8.9 - 10.3 mg/dL   GFR calc non Af Amer >60 >60 mL/min   GFR calc Af Amer >60 >60 mL/min    Comment: (NOTE) The eGFR has been calculated using the CKD EPI equation. This calculation has not been validated in all clinical situations. eGFR's persistently <60 mL/min signify possible Chronic Kidney Disease.    Anion  gap 9 5 - 15    ABGS No results for input(s): PHART, PO2ART, TCO2, HCO3 in the last 72 hours.  Invalid input(s): PCO2 CULTURES Recent Results (from the past 240 hour(s))  Urine culture     Status: None   Collection Time: 04/05/15  5:35 AM  Result Value Ref Range Status   Specimen Description URINE, CATHETERIZED  Final   Special Requests NONE  Final   Culture   Final    NO GROWTH 1 DAY Performed at [facility]    Report Status 04/06/2015 FINAL  Final  MRSA PCR Screening     Status: None   Collection Time: 04/05/15 10:00 AM  Result Value Ref Range Status   MRSA by PCR NEGATIVE NEGATIVE Final    Comment:        The GeneXpert MRSA Assay (FDA approved for NASAL specimens only), is one component of a comprehensive MRSA colonization surveillance program. It is not intended to diagnose MRSA infection nor to guide or monitor treatment for MRSA infections.   Culture, blood (routine x 2) Call MD if unable to obtain prior to antibiotics being given     Status: None   Collection Time: 04/05/15 10:30 AM  Result Value Ref Range Status   Specimen Description BLOOD LEFT ANTECUBITAL  Final   Special Requests BOTTLES DRAWN AEROBIC AND ANAEROBIC [place]  Final   Culture NO GROWTH 5 DAYS  Final   Report Status 04/10/2015 FINAL  Final  Culture, blood (routine x 2) Call MD if unable to obtain prior to antibiotics being given     Status: None   Collection Time: 04/05/15 10:36 AM  Result Value Ref Range Status   Specimen Description BLOOD RIGHT HAND  Final   Special Requests BOTTLES DRAWN AEROBIC AND ANAEROBIC 10CC EACH  Final   Culture NO GROWTH 5 DAYS  Final   Report Status 04/10/2015 FINAL  Final   Studies/Results: Ct Angio Chest Pe W/cm &/or Wo Cm  04/11/2015  CLINICAL DATA:  Increased shortness of breath. Difficulty breathing. EXAM: CT ANGIOGRAPHY CHEST WITH CONTRAST TECHNIQUE: Multidetector CT imaging of the chest was performed using the standard protocol during bolus  administration of intravenous contrast. Multiplanar CT image reconstructions and MIPs were obtained to evaluate the vascular anatomy. CONTRAST:  140m OMNIPAQUE IOHEXOL 350 MG/ML SOLN COMPARISON:  01/01/2009 FINDINGS: Mediastinum: The heart size appears normal. There is no pericardial effusion identified. The trachea appears patent and is midline. Moderate hiatal hernia. No mediastinal or hilar adenopathy. No supraclavicular or axillary adenopathy. No abnormal filling defects identified within the main pulmonary artery or its branches to suggest a clinically significant acute pulmonary embolus Lungs/Pleura: No pleural fluid identified. Moderate changes of centrilobular and paraseptal emphysema. No airspace consolidation, atelectasis or interstitial edema. Upper Abdomen: No focal liver abnormality identified. The visualized portions of the spleen and pancreas are normal. The adrenal glands are unremarkable. Musculoskeletal: No aggressive lytic or sclerotic bone lesions identified. Review of the MIP images confirms the above findings. IMPRESSION: 1. No acute pulmonary embolus. 2. Emphysema. 3. Hiatal hernia Electronically Signed   By: TKerby MoorsM.D.   On: 04/11/2015 11:44    Medications:  Prior to Admission:  Prescriptions prior to admission  Medication Sig Dispense Refill Last Dose  . albuterol (PROAIR HFA) 108 (90 BASE) MCG/ACT inhaler Inhale 2 puffs into the lungs every 6 (six) hours as needed for wheezing or shortness of breath.   Past Week at Unknown time  . aspirin EC 81 MG tablet Take 81 mg by mouth daily.   04/04/2015 at Unknown time  . atorvastatin (LIPITOR) 10 MG tablet Take 10 mg by mouth daily.   04/04/2015 at Unknown time  . calcium carbonate (TUMS - DOSED IN MG ELEMENTAL CALCIUM) 500 MG chewable tablet Chew 1 tablet by mouth as needed.   Past Week at Unknown time  . diltiazem (CARDIZEM CD) 300 MG 24 hr capsule Take 1 capsule (300 mg total) by mouth daily. 30 capsule 11 04/04/2015 at Unknown  time  . famotidine (PEPCID) 10 MG tablet Take 10 mg by mouth 2 (two) times daily.   Past Week at Unknown time  . furosemide (LASIX) 20 MG tablet Take 1 tab daily as needed for swelling 90 tablet 3 Past Week at Unknown time  . ipratropium-albuterol (DUONEB) 0.5-2.5 (3) MG/3ML SOLN Take 3 mLs by nebulization 4 (four) times daily.    Past Week at Unknown time  . Multiple Vitamin (MULTIVITAMIN) tablet Take 1 tablet by mouth daily.   04/04/2015 at Unknown time  . traZODone (DESYREL) 100 MG tablet Take 100 mg by mouth at bedtime.   04/04/2015 at Unknown time   Scheduled: . aspirin EC  81 mg Oral Daily  . atorvastatin  10 mg Oral Daily  . budesonide-formoterol  2 puff Inhalation BID  . diltiazem  300 mg Oral Daily  . enoxaparin (  LOVENOX) injection  40 mg Subcutaneous Q24H  . famotidine  10 mg Oral BID  . furosemide  40 mg Oral Daily  . ipratropium-albuterol  3 mL Nebulization Q6H  . methylPREDNISolone (SOLU-MEDROL) injection  80 mg Intravenous Q6H  . multivitamin with minerals  1 tablet Oral Daily  . sodium chloride  3 mL Intravenous Q12H  . traZODone  100 mg Oral QHS   Continuous:  ZOX:WRUEAV chloride, acetaminophen **OR** acetaminophen, albuterol, benzonatate, bisacodyl, HYDROcodone-homatropine, LORazepam, morphine CONCENTRATE, ondansetron **OR** ondansetron (ZOFRAN) IV, polyethylene glycol, senna-docusate, sodium chloride  Assesment: She has acute on chronic respiratory failure related to COPD exacerbation. On admission it was thought that she might have community-acquired pneumonia but that does not seem to be the case. She is slowly improving.  She has constipation I have taken the liberty of ordering a suppository which  she says is usually effective for her Principal Problem:   Acute on chronic respiratory failure ([place]) Active Problems:   COPD exacerbation ([place])   CAP (community acquired pneumonia)   Acute respiratory failure ([place])   Chronic obstructive pulmonary disease with acute  exacerbation ([place])    Plan: Continue current treatments. Since she didn't need BiPAP last night she may not need this at home. She will definitely need oxygen at home. I cut steroid dose to 60 mg every 6 hours with anticipation of cutting to 40 tomorrow and then looking at going to by mouth    LOS: 8 days   Sergio Hobart L 04/13/2015, 7:49 AM

## 2015-04-13 NOTE — Progress Notes (Signed)
The Pt is becoming very dependent on VM. RT explained to the Pt that O2 was a medicine and to much can cause a lot of problem and that we had to just use the [place]. RT left the [place] on 5L and stopped the  VM. The Pt stated that she didn't want to do her flutter because It made her cough. RT instructed her on the flutter and explained to her to only do it 2 at a time. This seemed to help the Pt not to cough as much.

## 2015-04-13 NOTE — Progress Notes (Signed)
PT Cancellation Note  Patient Details Name: Margaret Stokes MRN: [id number] DOB: 08/26/1950   Cancelled Treatment:    Reason Eval/Treat Not Completed: Patient not medically ready. Chart reviewed, RN consulted. Holding pt treatment at this time due to continued SOB at rest. Last vitals showing RR: 29, Hr: 90 bpm, SaO2: 94%, on 9L/min O2. Rn reports pt is desaturating and worsen SOB with attempting to take medications. Will attempt at later date/time one patient is more likely to tolerate activity.     Buccola,Allan C 04/13/2015, 11:37 AM  11:39 AM  Rosamaria LintsAllan C Buccola, PT, DPT [place] License # [id number]

## 2015-04-13 NOTE — Progress Notes (Signed)
[facility] PROGRESS NOTE  Margaret Stokes ZOX:[id number] DOB: 09/29/1950 DOA: 04/05/2015 PCP: Briscoe BurnsSLATE,SCOTT, PA-C  64 ? 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 Edema? Vascular congestion + COPD findings Pulmonology consult  Assessment/Plan:  Acute on chronic respiratory failure 2/2 to decompnesated emphysema/COPD -some improvement overnight 1/9-->1/10 am,  04/09/15--expect very slow taper on steroids has been initiated 04/13/15 -CT chest performed 1/8 and neg for PE or any effusion - low dose roxanol 0.25 for dyspnea -baseline functional state is diminished -Continue albuterol 2.5 every 2 when necessary -lasix IV added 1/6 with I/o now net -5.8 and transitioned back to PO lasix 40 OD  Acute superimposed on Copd exacerbation -hasn't been febrile so unlikely PNA -CT refutes PNA -All culture data remains negative to date. -wean off Rocephin + azithromycin compeltely on 1/7  -There was some concern of possible bacteriuria however her urine cultures negative   Acute on chronic diastolic CHF -Echo in November 2015 shows normal ejection fraction with grade 1 diastolic dysfunction.  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 Monitor on telemetry currently on Cardizem 300 daily which controls her HR Re-consider AC per Cardiology as OP  acute kidney injury Probably from Lasix use, transitioned to by mouth 04/07/15  hyperlipidemia Continue atorvastatin 10 daily  Constipation -d/c senna and colace Add Lactulose 30 ml and 2 hours after give Dulcolax 2 tabs to see for effect    Code Status: Full code Family Communication:  Patient only  Disposition Plan: hopefully transfer out of SDU later today-expect 2-3 days more in-hospital care needed to optimise Therapy eval needed   Consultants:  Pulmonary  Antibiotics:  Rocephin 1/1-1/3  Azithromycin 1/2-1/8  Subjective:  Better No use of Bipap overnight No cp No n/v No blurred nor double vision Mild sputum onlt    Objective: Filed Vitals:   04/13/15 0800 04/13/15 0822 04/13/15 0826 04/13/15 1000  BP: 115/74   123/70  Pulse: 95   90  Temp:   97 F (36.1 C)   TempSrc:   Oral   Resp: 27   29  Height:      Weight:      SpO2: 94% 98%  95%    Intake/Output Summary (Last 24 hours) at 04/13/15 1112 Last data filed at 04/13/15 0500  Gross per 24 hour  Intake   1083 ml  Output   1425 ml  Net   -342 ml   Filed Weights   04/11/15 0500 04/12/15 0626 04/13/15 0500  Weight: 84.7 kg (186 lb 11.7 oz) 84.6 kg (186 lb 8.2 oz) 83.5 kg (184 lb 1.4 oz)    Exam:   General:  Alert, awake, oriented 3  Cardiovascular: Tachycardic  Respiratory: BS improved slightly  Abdomen: Soft, nontender, nondistended  Extremities: trace LE edema bilaterally   Neurologic:  Grossly intact and nonfocal  Data Reviewed: Basic Metabolic Panel:  Recent Labs Lab 04/07/15 0512 04/08/15 0442 04/11/15 0446 04/12/15 0428  NA 142 144 141 140  K 4.2 4.1 3.8 3.8  CL 102 105 99* 98*  CO2 31 31 33* 33*  GLUCOSE 165* 127* 168* 171*  BUN 34* 29* 31* 35*  CREATININE 0.87 0.71 0.76 0.80  CALCIUM 8.7* 8.6* 8.6* 8.4*  Liver Function Tests:  Recent Labs Lab 04/08/15 0442  AST 25  ALT 28  ALKPHOS 65  BILITOT 0.4  PROT 5.9*  ALBUMIN 3.3*   No results for input(s): LIPASE, AMYLASE in the last 168 hours. No results for input(s): AMMONIA in the last 168 hours. CBC:  Recent Labs Lab 04/07/15 0512 04/08/15 0442  WBC 9.5 10.2  NEUTROABS  --  8.0*  HGB 13.1 13.0  HCT 39.5 40.0  MCV 96.1 96.6  PLT 201 198   Cardiac Enzymes: No results for input(s):  CKTOTAL, CKMB, CKMBINDEX, TROPONINI in the last 168 hours. BNP (last 3 results)  Recent Labs  04/07/15 0512  BNP 26.0    ProBNP (last 3 results) No results for input(s): PROBNP in the last 8760 hours.  CBG: No results for input(s): GLUCAP in the last 168 hours.  Recent Results (from the past 240 hour(s))  Urine culture     Status: None   Collection Time: 04/05/15  5:35 AM  Result Value Ref Range Status   Specimen Description URINE, CATHETERIZED  Final   Special Requests NONE  Final   Culture   Final    NO GROWTH 1 DAY Performed at [facility]    Report Status 04/06/2015 FINAL  Final  MRSA PCR Screening     Status: None   Collection Time: 04/05/15 10:00 AM  Result Value Ref Range Status   MRSA by PCR NEGATIVE NEGATIVE Final    Comment:        The GeneXpert MRSA Assay (FDA approved for NASAL specimens only), is one component of a comprehensive MRSA colonization surveillance program. It is not intended to diagnose MRSA infection nor to guide or monitor treatment for MRSA infections.   Culture, blood (routine x 2) Call MD if unable to obtain prior to antibiotics being given     Status: None   Collection Time: 04/05/15 10:30 AM  Result Value Ref Range Status   Specimen Description BLOOD LEFT ANTECUBITAL  Final   Special Requests BOTTLES DRAWN AEROBIC AND ANAEROBIC 10CC EACH  Final   Culture NO GROWTH 5 DAYS  Final   Report Status 04/10/2015 FINAL  Final  Culture, blood (routine x 2) Call MD if unable to obtain prior to antibiotics being given     Status: None   Collection Time: 04/05/15 10:36 AM  Result Value Ref Range Status   Specimen Description BLOOD RIGHT HAND  Final   Special Requests BOTTLES DRAWN AEROBIC AND ANAEROBIC 10CC EACH  Final   Culture NO GROWTH 5 DAYS  Final   Report Status 04/10/2015 FINAL  Final     Studies: Ct Angio Chest Pe W/cm &/or Wo Cm  04/11/2015  CLINICAL DATA:  Increased shortness of breath. Difficulty breathing. EXAM: CT  ANGIOGRAPHY CHEST WITH CONTRAST TECHNIQUE: Multidetector CT imaging of the chest was performed using the standard protocol during bolus administration of intravenous contrast. Multiplanar CT image reconstructions and MIPs were obtained to evaluate the vascular anatomy. CONTRAST:  OMNIPAQUE IOHEXOL 350 MG/ML SOLN COMPARISON:  01/01/2009 FINDINGS: Mediastinum: The heart size appears normal. There is no pericardial effusion identified. The trachea appears patent and is midline. Moderate hiatal hernia. No mediastinal or hilar adenopathy. No supraclavicular or axillary adenopathy. No abnormal filling defects identified within the main pulmonary artery or its branches to suggest a clinically significant acute pulmonary embolus Lungs/Pleura: No pleural fluid identified. Moderate changes of centrilobular and paraseptal emphysema. No airspace consolidation, atelectasis or interstitial edema. Upper Abdomen: No focal  liver abnormality identified. The visualized portions of the spleen and pancreas are normal. The adrenal glands are unremarkable. Musculoskeletal: No aggressive lytic or sclerotic bone lesions identified. Review of the MIP images confirms the above findings. IMPRESSION: 1. No acute pulmonary embolus. 2. Emphysema. 3. Hiatal hernia Electronically Signed   By: Signa Kell M.D.   On: 04/11/2015 11:44    Scheduled Meds: . aspirin EC  81 mg Oral Daily  . atorvastatin  10 mg Oral Daily  . budesonide-formoterol  2 puff Inhalation BID  . diltiazem  300 mg Oral Daily  . enoxaparin (LOVENOX) injection  40 mg Subcutaneous Q24H  . famotidine  10 mg Oral BID  . furosemide  40 mg Oral Daily  . ipratropium-albuterol  3 mL Nebulization Q6H  . methylPREDNISolone (SOLU-MEDROL) injection  60 mg Intravenous Q6H  . multivitamin with minerals  1 tablet Oral Daily  . sodium chloride  3 mL Intravenous Q12H  . sorbitol  30 mL Oral Once  . traZODone  100 mg Oral QHS   Continuous Infusions:   Principal  Problem:   Acute on chronic respiratory failure (HCC) Active Problems:   COPD exacerbation (HCC)   CAP (community acquired pneumonia)   Acute respiratory failure (HCC)   Chronic obstructive pulmonary disease with acute exacerbation (HCC)    Time spent: 15 minutes. Greater than 50% of this time was spent in direct contact with the patient coordinating care.    Pleas Koch, MD [facility] [phone]

## 2015-04-14 DIAGNOSIS — J9621 Acute and chronic respiratory failure with hypoxia: Secondary | ICD-10-CM

## 2015-04-14 DIAGNOSIS — I5033 Acute on chronic diastolic (congestive) heart failure: Secondary | ICD-10-CM | POA: Diagnosis present

## 2015-04-14 DIAGNOSIS — K5900 Constipation, unspecified: Secondary | ICD-10-CM

## 2015-04-14 DIAGNOSIS — J441 Chronic obstructive pulmonary disease with (acute) exacerbation: Secondary | ICD-10-CM

## 2015-04-14 MED ORDER — MILK AND MOLASSES ENEMA
1.0000 | Freq: Once | RECTAL | Status: AC
  Administered 2015-04-14: 250 mL via RECTAL

## 2015-04-14 MED ORDER — METHYLPREDNISOLONE SODIUM SUCC 40 MG IJ SOLR
40.0000 mg | Freq: Four times a day (QID) | INTRAMUSCULAR | Status: DC
  Administered 2015-04-14 – 2015-04-15 (×4): 40 mg via INTRAVENOUS
  Filled 2015-04-14 (×4): qty 1

## 2015-04-14 NOTE — Progress Notes (Signed)
Siotting on side of bed, requesting breathing treatment, respiratory called, tied up in ED and will come As soon as possible.  PAtient informed

## 2015-04-14 NOTE — Progress Notes (Signed)
[facility] PROGRESS NOTE  Margaret Stokes [id number] DOB: 11/22/1950 DOA: 04/05/2015 PCP: Carlean JewsSLATE,SCOTT, PA-C  Assessment/Plan: 1. COPD exacerbation, slowly improving with IV steroids and supplemental O2. Completed course of abx. CT chest negative. Afebrile. Pulmonology input appreciated.  2. Acute on chronic hypoxic respiratory failure, currently on 6L of oxygen via [place]. Will wean as tolerated.  3. Acute on chronic diastolic CHF, ECHO from 11/15 shows normal ejection fraction with grade 1 diastolic dysfunction. Continue oral Lasix and monitor I&Os. Appears compensated at this time. 4. AKI, likely related to Lasix use. Stable. Continue to monitor in the setting of diuresis.  5. HLD, continue statin 6. Constipation, will give milk and molasess enema 7. History of SVT. Currently controlled on cardizem. Follow up with cardiology.  Code Status: Full DVT prophylaxis: Lovenox Family Communication: no family present Disposition Plan: discharge home once improved   Consultants:  Pulmonary  Procedures:    Antibiotics:  Rocephin 1/1 >> 1/3  Azithromycin 1/2 >> 1/8  HPI/Subjective: Complaining that she has not had a bowel movement in the last week. She is still short of breath  Objective: Filed Vitals:   04/14/15 0659 04/14/15 0746  BP: 110/72   Pulse: 80 88  Temp: 98.1 F (36.7 C)   Resp: 20 20    Intake/Output Summary (Last 24 hours) at 04/14/15 0823 Last data filed at 04/13/15 1819  Gross per 24 hour  Intake      0 ml  Output   1150 ml  Net  -1150 ml   Filed Weights   04/12/15 0626 04/13/15 0500 04/14/15 0659  Weight: 84.6 kg (186 lb 8.2 oz) 83.5 kg (184 lb 1.4 oz) 88.4 kg (194 lb 14.2 oz)    Exam:  General: mild increase work of breathing Cardiovascular: RRR, S1, S2  Respiratory: diminished breath sounds, no wheezing, crackles at bases Abdomen: soft, non tender, no distention , bowel sounds normal Musculoskeletal: No edema b/l  Data  Reviewed: Basic Metabolic Panel:  Recent Labs Lab 04/08/15 0442 04/11/15 0446 04/12/15 0428  NA 144 141 140  K 4.1 3.8 3.8  CL 105 99* 98*  CO2 31 33* 33*  GLUCOSE 127* 168* 171*  BUN 29* 31* 35*  CREATININE 0.71 0.76 0.80  CALCIUM 8.6* 8.6* 8.4*   Liver Function Tests:  Recent Labs Lab 04/08/15 0442  AST 25  ALT 28  ALKPHOS 65  BILITOT 0.4  PROT 5.9*  ALBUMIN 3.3*   CBC:  Recent Labs Lab 04/08/15 0442  WBC 10.2  NEUTROABS 8.0*  HGB 13.0  HCT 40.0  MCV 96.6  PLT 198   BNP (last 3 results)  Recent Labs  04/07/15 0512  BNP 26.0     Recent Results (from the past 240 hour(s))  Urine culture     Status: None   Collection Time: 04/05/15  5:35 AM  Result Value Ref Range Status   Specimen Description URINE, CATHETERIZED  Final   Special Requests NONE  Final   Culture   Final    NO GROWTH 1 DAY [facility]formed [facility]    Report Status 04/06/2015 FINAL  Final  MRSA PCR Screening     Status: None   Collection Time: 04/05/15 10:00 AM  Result Value Ref Range Status   MRSA by PCR NEGATIVE NEGATIVE Final    Comment:        The GeneXpert MRSA Assay (FDA approved for NASAL specimens only), is one component of a comprehensive MRSA colonization surveillance program. It is  not intended to diagnose MRSA infection nor to guide or monitor treatment for MRSA infections.   Culture, blood (routine x 2) Call MD if unable to obtain prior to antibiotics being given     Status: None   Collection Time: 04/05/15 10:30 AM  Result Value Ref Range Status   Specimen Description BLOOD LEFT ANTECUBITAL  Final   Special Requests BOTTLES DRAWN AEROBIC AND ANAEROBIC 10CC EACH  Final   Culture NO GROWTH 5 DAYS  Final   Report Status 04/10/2015 FINAL  Final  Culture, blood (routine x 2) Call MD if unable to obtain prior to antibiotics being given     Status: None   Collection Time: 04/05/15 10:36 AM  Result Value Ref Range Status   Specimen Description BLOOD RIGHT  HAND  Final   Special Requests BOTTLES DRAWN AEROBIC AND ANAEROBIC 10CC EACH  Final   Culture NO GROWTH 5 DAYS  Final   Report Status 04/10/2015 FINAL  Final      Scheduled Meds: . aspirin EC  81 mg Oral Daily  . atorvastatin  10 mg Oral Daily  . budesonide-formoterol  2 puff Inhalation BID  . diltiazem  300 mg Oral Daily  . enoxaparin (LOVENOX) injection  40 mg Subcutaneous Q24H  . famotidine  10 mg Oral BID  . furosemide  40 mg Oral Daily  . ipratropium-albuterol  3 mL Nebulization Q6H  . methylPREDNISolone (SOLU-MEDROL) injection  60 mg Intravenous Q6H  . multivitamin with minerals  1 tablet Oral Daily  . sodium chloride  3 mL Intravenous Q12H  . traZODone  100 mg Oral QHS   Continuous Infusions:   Principal Problem:   Acute on chronic respiratory failure (HCC) Active Problems:   COPD exacerbation (HCC)   CAP (community acquired pneumonia)   Acute respiratory failure (HCC)   Chronic obstructive pulmonary disease with acute exacerbation (HCC)    Time spent:   [employer]. MD  [facility] Pager [phone]. If 7PM-7AM, please contact night-coverage at [email or web address], password [facility] 04/14/2015, 8:23 AM  LOS: 9 days

## 2015-04-14 NOTE — Progress Notes (Signed)
Adequate resuts from MOM enema, breathing easier after administration of ativan and Morphine.

## 2015-04-14 NOTE — Progress Notes (Signed)
Subjective: She says she feels better. She is still short of breath. She says she is not close to her baseline she is still complaining of constipation  Objective: Vital signs in last 24 hours: Temp:  [97.9 F (36.6 C)-98.5 F (36.9 C)] 98.1 F (36.7 C) (01/11 0659) Pulse Rate:  [80-101] 88 (01/11 0746) Resp:  [19-29] 20 (01/11 0746) BP: (103-139)/(59-112) 110/72 mmHg (01/11 0659) SpO2:  [91 %-100 %] 91 % (01/11 0746) Weight:  [88.4 kg (194 lb 14.2 oz)] 88.4 kg (194 lb 14.2 oz) (01/11 0659) Weight change: 4.9 kg (10 lb 12.8 oz) Last BM Date: 04/04/15  Intake/Output from previous day: 01/10 0701 - 01/11 0700 In: -  Out: 1150 [Urine:1150]  PHYSICAL EXAM General appearance: alert, cooperative and mild distress Resp: Decreased breath sounds, tachypnea and prolonged expiration Cardio: regular rate and rhythm, S1, S2 normal, no murmur, click, rub or gallop GI: soft, non-tender; bowel sounds normal; no masses,  no organomegaly Extremities: extremities normal, atraumatic, no cyanosis or edema  Lab Results:  No results found for this or any previous visit (from the past 48 hour(s)).  ABGS No results for input(s): PHART, PO2ART, TCO2, HCO3 in the last 72 hours.  Invalid input(s): PCO2 CULTURES Recent Results (from the past 240 hour(s))  Urine culture     Status: None   Collection Time: 04/05/15  5:35 AM  Result Value Ref Range Status   Specimen Description URINE, CATHETERIZED  Final   Special Requests NONE  Final   Culture   Final    NO GROWTH 1 DAY [facility]    Report Status 04/06/2015 FINAL  Final  MRSA PCR Screening     Status: None   Collection Time: 04/05/15 10:00 AM  Result Value Ref Range Status   MRSA by PCR NEGATIVE NEGATIVE Final    Comment:        The GeneXpert MRSA Assay (FDA approved for NASAL specimens only), is one component of a comprehensive MRSA colonization surveillance program. It is not intended to diagnose MRSA infection  nor to guide or monitor treatment for MRSA infections.   Culture, blood (routine x 2) Call MD if unable to obtain prior to antibiotics being given     Status: None   Collection Time: 04/05/15 10:30 AM  Result Value Ref Range Status   Specimen Description BLOOD LEFT ANTECUBITAL  Final   Special Requests BOTTLES DRAWN AEROBIC AND ANAEROBIC 10CC EACH  Final   Culture NO GROWTH 5 DAYS  Final   Report Status 04/10/2015 FINAL  Final  Culture, blood (routine x 2) Call MD if unable to obtain prior to antibiotics being given     Status: None   Collection Time: 04/05/15 10:36 AM  Result Value Ref Range Status   Specimen Description BLOOD RIGHT HAND  Final   Special Requests BOTTLES DRAWN AEROBIC AND ANAEROBIC 10CC EACH  Final   Culture NO GROWTH 5 DAYS  Final   Report Status 04/10/2015 FINAL  Final   Studies/Results: No results found.  Medications:  Prior to Admission:  Prescriptions prior to admission  Medication Sig Dispense Refill Last Dose  . albuterol (PROAIR HFA) 108 (90 BASE) MCG/ACT inhaler Inhale 2 puffs into the lungs every 6 (six) hours as needed for wheezing or shortness of breath.   Past Week at Unknown time  . aspirin EC 81 MG tablet Take 81 mg by mouth daily.   04/04/2015 at Unknown time  . atorvastatin (LIPITOR) 10 MG tablet Take  10 mg by mouth daily.   04/04/2015 at Unknown time  . calcium carbonate (TUMS - DOSED IN MG ELEMENTAL CALCIUM) 500 MG chewable tablet Chew 1 tablet by mouth as needed.   Past Week at Unknown time  . diltiazem (CARDIZEM CD) 300 MG 24 hr capsule Take 1 capsule (300 mg total) by mouth daily. 30 capsule 11 04/04/2015 at Unknown time  . famotidine (PEPCID) 10 MG tablet Take 10 mg by mouth 2 (two) times daily.   Past Week at Unknown time  . furosemide (LASIX) 20 MG tablet Take 1 tab daily as needed for swelling 90 tablet 3 Past Week at Unknown time  . ipratropium-albuterol (DUONEB) 0.5-2.5 (3) MG/3ML SOLN Take 3 mLs by nebulization 4 (four) times daily.    Past  Week at Unknown time  . Multiple Vitamin (MULTIVITAMIN) tablet Take 1 tablet by mouth daily.   04/04/2015 at Unknown time  . traZODone (DESYREL) 100 MG tablet Take 100 mg by mouth at bedtime.   04/04/2015 at Unknown time   Scheduled: . aspirin EC  81 mg Oral Daily  . atorvastatin  10 mg Oral Daily  . budesonide-formoterol  2 puff Inhalation BID  . diltiazem  300 mg Oral Daily  . enoxaparin (LOVENOX) injection  40 mg Subcutaneous Q24H  . famotidine  10 mg Oral BID  . furosemide  40 mg Oral Daily  . ipratropium-albuterol  3 mL Nebulization Q6H  . methylPREDNISolone (SOLU-MEDROL) injection  40 mg Intravenous Q6H  . multivitamin with minerals  1 tablet Oral Daily  . sodium chloride  3 mL Intravenous Q12H  . traZODone  100 mg Oral QHS   Continuous:  WUJ:WJXBJY chloride, acetaminophen **OR** acetaminophen, albuterol, benzonatate, bisacodyl, HYDROcodone-homatropine, LORazepam, morphine CONCENTRATE, ondansetron **OR** ondansetron (ZOFRAN) IV, polyethylene glycol, sodium chloride  Assesment: She was admitted with acute on chronic respiratory failure initially thought to be related to community-acquired pneumonia. However I think that was more likely atelectasis. She is slowly improving. She has severe COPD at baseline Principal Problem:   Acute on chronic respiratory failure (HCC) Active Problems:   COPD exacerbation (HCC)   CAP (community acquired pneumonia)   Acute respiratory failure (HCC)   Chronic obstructive pulmonary disease with acute exacerbation (HCC)    Plan: I am continuing to very slowly taper her steroids. I reduced her dose again today    LOS: 9 days   Margaret Stokes L 04/14/2015, 9:38 AM

## 2015-04-14 NOTE — Progress Notes (Signed)
Requested Ativan after prn neb. Given.  Will continue to monitor.

## 2015-04-14 NOTE — Plan of Care (Signed)
Problem: Acute Rehab PT Goals(only PT should resolve) Goal: Pt Will Ambulate Pt will ambulate with RW at Supervision on 2L/min O2 for a distances greater than 18900ft to demonstrate the ability to perform safe household distance ambulation at discharge.    Goal: Pt Will Go Up/Down Stairs Pt will ascend/descend 5 stairs on 2L/min O2 with LRAD and 1 HR at Supervision to demonstrate safe entry/exit of home.

## 2015-04-14 NOTE — Evaluation (Signed)
Physical Therapy Evaluation Patient Details Name: Margaret Stokes MRN: [id number] DOB: [birth year] Today's Date: 04/14/2015   History of Present Illness  65yo white female who comes to [facility] after worsening SOB. PMH includes COPD, SVT (on cardizem), HLD. Pt was admitted with acute on chronic respiratory failure and has been struggling to maintain O2 sats at rest for th epast several days. At evaluation, pt has recently exited the ICU, now on unit 300, and  on 6L/min.   Clinical Impression  Pt received seated in bed, upset that PT is starting prior to her enema, as she remains quite uncomfortable in the absence of a bowel movement. Pt is a good historian, reporting full independence PTA, but very limited in IADL due to dyspnea on exertion. Pt is on 6L/min throughout session, with SaO2 remaining stable during seated muscle and balance testing. Upon standing, SaO2 drops to 88%, RR 36, and pt performing exclusively mouth breathing. Pt given cues to utilize [place] supplemental O2 and inhale throughout nose, but patient reports she has not been able to breathe through her nose since she was a child. Pt tolerates only 30 seconds of standing and then has to sit down due to fatigue and weakness. Pt is unable to attempt walking at this time. Pt will benefit from skilled PT intervention to address the aforementioned deficits, impairments, and restrictions to help patient return to prior level of indep in ADL, IADL, and limited community distance mobility. Recommending STR at DC.     Follow Up Recommendations SNF    Equipment Recommendations  Rolling walker with 5" wheels    Recommendations for Other Services       Precautions / Restrictions Precautions Precautions: None Precaution Comments: no score available Restrictions Weight Bearing Restrictions: No      Mobility  Bed Mobility Overal bed mobility: Modified Independent             General bed mobility comments: additional effort/time.    Transfers Overall transfer level: Needs assistance Equipment used: None Transfers: Sit to/from Stand Sit to Stand: Supervision            Ambulation/Gait Ambulation/Gait assistance:  (pt unable due to worsening SOB, abdominal pain)              Stairs            Wheelchair Mobility    Modified Rankin (Stroke Patients Only)       Balance Overall balance assessment: No apparent balance deficits (not formally assessed)                                           Pertinent Vitals/Pain Pain Assessment: 0-10 Pain Score: 6  Pain Location: abd pain, has not defecated in 10 days per patient report.  Pain Intervention(s): Limited activity within patient's tolerance;Monitored during session    Home Living Family/patient expects to be discharged to:: Private residence Living Arrangements: Alone   Type of Home: House Home Access: Stairs to enter Entrance Stairs-Rails: None Entrance Stairs-Number of Steps: 2 Home Layout: One level Home Equipment: None      Prior Function Level of Independence: Independent         Comments: IADL community activities severely limited by SOB at baseline. Reports needing assistance carrying in groceries, etc.      Hand Dominance   Dominant Hand: Right    Extremity/Trunk Assessment   Upper  Extremity Assessment: Overall WFL for tasks assessed           Lower Extremity Assessment: Overall WFL for tasks assessed (5/5 throughout, SaO2 stable during testing on 6L. )      Cervical / Trunk Assessment:  (flexed trunk. )  Communication   Communication: No difficulties  Cognition Arousal/Alertness: Awake/alert Behavior During Therapy: WFL for tasks assessed/performed Overall Cognitive Status: Within Functional Limits for tasks assessed                      General Comments      Exercises        Assessment/Plan    PT Assessment Patient needs continued PT services  PT Diagnosis  Difficulty walking;Acute pain;Generalized weakness   PT Problem List Decreased strength;Decreased mobility;Decreased activity tolerance;Decreased cognition;Decreased knowledge of precautions;Cardiopulmonary status limiting activity;Pain  PT Treatment Interventions Therapeutic exercise;Balance training;Stair training;Functional mobility training;Therapeutic activities;Patient/family education   PT Goals (Current goals can be found in the Care Plan section) Acute Rehab PT Goals Patient Stated Goal: Be able to breath better PT Goal Formulation: With patient Time For Goal Achievement: 04/28/15 Potential to Achieve Goals: Fair    Frequency Min 3X/week   Barriers to discharge Decreased caregiver support [place], [place]; Son lives 30 minutes away but is a long distance Actuarytruck dirver.     Co-evaluation               End of Session Equipment Utilized During Treatment: Gait belt;Oxygen Activity Tolerance: Patient limited by fatigue;Patient limited by pain Patient left: in bed;with call bell/phone within reach Nurse Communication: Other (comment)         Time: 4098-1191: 1520-1537 PT Time Calculation (min) (ACUTE ONLY): 17 min   Charges:   PT Evaluation $PT Eval Low Complexity: 1 Procedure     PT G Codes:        Buccola,Allan C 04/14/2015, 10:12 PM  10:19 PM  Rosamaria LintsAllan C Buccola, PT, DPT [place] License # [id number]

## 2015-04-15 LAB — BASIC METABOLIC PANEL
Anion gap: 10 (ref 5–15)
BUN: 33 mg/dL — ABNORMAL HIGH (ref 6–20)
CALCIUM: 8.4 mg/dL — AB (ref 8.9–10.3)
CO2: 34 mmol/L — AB (ref 22–32)
CREATININE: 0.77 mg/dL (ref 0.44–1.00)
Chloride: 98 mmol/L — ABNORMAL LOW (ref 101–111)
GFR calc non Af Amer: >60 mL/min (ref >60)
Glucose, Bld: 170 mg/dL — ABNORMAL HIGH (ref 65–99)
Potassium: 4.3 mmol/L (ref 3.5–5.1)
SODIUM: 142 mmol/L (ref 135–145)

## 2015-04-15 LAB — CBC
HCT: 45.5 % (ref 36.0–46.0)
Hemoglobin: 14.9 g/dL (ref 12.0–15.0)
MCH: 31.2 pg (ref 26.0–34.0)
MCHC: 32.7 g/dL (ref 30.0–36.0)
MCV: 95.4 fL (ref 78.0–100.0)
PLATELETS: 201 10*3/uL (ref 150–400)
RBC: 4.77 MIL/uL (ref 3.87–5.11)
RDW: 12.9 % (ref 11.5–15.5)
WBC: 14.5 10*3/uL — AB (ref 4.0–10.5)

## 2015-04-15 MED ORDER — METHYLPREDNISOLONE SODIUM SUCC 40 MG IJ SOLR
40.0000 mg | Freq: Every day | INTRAMUSCULAR | Status: DC
  Administered 2015-04-15 – 2015-04-16 (×2): 40 mg via INTRAVENOUS
  Filled 2015-04-15 (×2): qty 1

## 2015-04-15 MED ORDER — METHYLPREDNISOLONE SODIUM SUCC 40 MG IJ SOLR: 40.0000 mg | Freq: Two times a day (BID) | INTRAMUSCULAR | Status: DC

## 2015-04-15 MED ORDER — IPRATROPIUM-ALBUTEROL 0.5-2.5 (3) MG/3ML IN SOLN
3.0000 mL | Freq: Four times a day (QID) | RESPIRATORY_TRACT | Status: DC
  Administered 2015-04-15 – 2015-04-16 (×6): 3 mL via RESPIRATORY_TRACT
  Filled 2015-04-15 (×6): qty 3

## 2015-04-15 NOTE — Progress Notes (Signed)
Pt changed over to 5lpm cann per pt request so that she can blow on flutter valve.. Pt sat 91%.

## 2015-04-15 NOTE — NC FL2 (Signed)
[place] MEDICAID FL2 LEVEL OF CARE SCREENING TOOL     IDENTIFICATION  Patient Name: Margaret Stokes Birthdate: 1950-08-26 Sex: female Admission Date (Current Location): 04/05/2015  [facility] and [place] Number:      Facility and Address:  [facility],  [address] [place] [address]      Provider Number: [id number]  Attending Physician Name and Address:  Erick Blinks, MD  Relative Name and Phone Number:       Current Level of Care: Hospital Recommended Level of Care: Skilled Nursing Facility Prior Approval Number:    Date Approved/Denied:   PASRR Number: [id number]  Discharge Plan: SNF    Current Diagnoses: Patient Active Problem List   Diagnosis Date Noted  . Acute on chronic diastolic CHF (congestive heart failure) (HCC) 04/14/2015  . Acute respiratory failure (HCC)   . Chronic obstructive pulmonary disease with acute exacerbation (HCC)   . COPD exacerbation (HCC) 04/05/2015  . Acute on chronic respiratory failure (HCC) 04/05/2015  . CAP (community acquired pneumonia) 04/05/2015  . Peripheral edema 01/08/2012  . Emphysema 01/08/2012  . SVT (supraventricular tachycardia) (HCC)   . Tobacco abuse     Orientation RESPIRATION BLADDER Height & Weight    Self, Time, Situation, Place  O2 (5L) Continent 5\' 9"  (175.3 cm) 184 lbs.  BEHAVIORAL SYMPTOMS/MOOD NEUROLOGICAL BOWEL NUTRITION STATUS  Other (Comment) (n/a)  (n/a) Continent Diet (Regular)  AMBULATORY STATUS COMMUNICATION OF NEEDS Skin   Extensive Assist Verbally Normal                       Personal Care Assistance Level of Assistance  Bathing, Feeding, Dressing Bathing Assistance: Maximum assistance Feeding assistance: Independent Dressing Assistance: Maximum assistance     Functional Limitations Info  Sight, Hearing, Speech Sight Info: Adequate Hearing Info: Adequate Speech Info: Adequate    SPECIAL CARE FACTORS FREQUENCY  PT (By licensed PT)     PT Frequency: 5              Contractures Contractures Info: Not present    Additional Factors Info  Psychotropic Code Status Info: Full code Allergies Info: No known allergies Psychotropic Info: Trazodone         Current Medications (04/15/2015):  This is the current hospital active medication list Current Facility-Administered Medications  Medication Dose Route Frequency Provider Last Rate Last Dose  . 0.9 %  sodium chloride infusion  250 mL Intravenous PRN Henderson Cloud, MD 10 mL/hr at 04/08/15 2200 250 mL at 04/08/15 2200  . acetaminophen (TYLENOL) tablet 650 mg  650 mg Oral Q6H PRN Henderson Cloud, MD       Or  . acetaminophen (TYLENOL) suppository 650 mg  650 mg Rectal Q6H PRN Henderson Cloud, MD      . albuterol (PROVENTIL) (2.5 MG/3ML) 0.083% nebulizer solution 2.5 mg  2.5 mg Nebulization Q2H PRN Henderson Cloud, MD   2.5 mg at 04/14/15 1814  . aspirin EC tablet 81 mg  81 mg Oral Daily Henderson Cloud, MD   81 mg at 04/14/15 0935  . atorvastatin (LIPITOR) tablet 10 mg  10 mg Oral Daily Henderson Cloud, MD   10 mg at 04/14/15 0934  . benzonatate (TESSALON) capsule 200 mg  200 mg Oral TID PRN Kari Baars, MD      . bisacodyl (DULCOLAX) EC tablet 10 mg  10 mg Oral Daily PRN Rhetta Mura, MD   10  mg at 04/13/15 1621  . budesonide-formoterol (SYMBICORT) 160-4.5 MCG/ACT inhaler 2 puff  2 puff Inhalation BID Kari BaarsEdward Hawkins, MD   2 puff at 04/15/15 0824  . diltiazem (CARDIZEM CD) 24 hr capsule 300 mg  300 mg Oral Daily Estela Isaiah BlakesY Hernandez Acosta, MD   300 mg at 04/14/15 0934  . enoxaparin (LOVENOX) injection 40 mg  40 mg Subcutaneous Q24H Henderson CloudEstela Y Hernandez Acosta, MD   40 mg at 04/14/15 1400  . famotidine (PEPCID) tablet 10 mg  10 mg Oral BID Henderson CloudEstela Y Hernandez Acosta, MD   10 mg at 04/14/15 2150  . furosemide (LASIX) tablet 40 mg  40 mg Oral Daily Rhetta MuraJai-Gurmukh Samtani, MD   40 mg at 04/14/15 0936  . HYDROcodone-homatropine (HYCODAN)  5-1.5 MG/5ML syrup 5 mL  5 mL Oral Q4H PRN Kari BaarsEdward Hawkins, MD   5 mL at 04/14/15 0049  . ipratropium-albuterol (DUONEB) 0.5-2.5 (3) MG/3ML nebulizer solution 3 mL  3 mL Nebulization Q6H WA Erick BlinksJehanzeb Memon, MD   3 mL at 04/15/15 0817  . LORazepam (ATIVAN) tablet 0.5 mg  0.5 mg Oral Q6H PRN Jinger NeighborsMary A Lynch, NP   0.5 mg at 04/14/15 1843  . methylPREDNISolone sodium succinate (SOLU-MEDROL) 40 mg/mL injection 40 mg  40 mg Intravenous Q12H Kari BaarsEdward Hawkins, MD      . morphine CONCENTRATE 10 MG/0.5ML oral solution 5 mg  5 mg Oral Q4H PRN Rhetta MuraJai-Gurmukh Samtani, MD   5 mg at 04/14/15 1910  . multivitamin with minerals tablet 1 tablet  1 tablet Oral Daily Henderson CloudEstela Y Hernandez Acosta, MD   1 tablet at 04/14/15 [phone]  . ondansetron (ZOFRAN) tablet 4 mg  4 mg Oral Q6H PRN Henderson CloudEstela Y Hernandez Acosta, MD       Or  . ondansetron [facility](ZOFRAN) injection 4 mg  4 mg Intravenous Q6H PRN Henderson CloudEstela Y Hernandez Acosta, MD      . polyethylene glycol (MIRALAX / GLYCOLAX) packet 17 g  17 g Oral Daily PRN Rhetta MuraJai-Gurmukh Samtani, MD   17 g at 04/13/15 2057  . sodium chloride 0.9 % injection 3 mL  3 mL Intravenous Q12H Henderson CloudEstela Y Hernandez Acosta, MD   3 mL at 04/14/15 2153  . sodium chloride 0.9 % injection 3 mL  3 mL Intravenous PRN Henderson CloudEstela Y Hernandez Acosta, MD      . traZODone (DESYREL) tablet 100 mg  100 mg Oral QHS Henderson CloudEstela Y Hernandez Acosta, MD   100 mg at 04/14/15 2150     Discharge Medications: Please see discharge summary for a list of discharge medications.  Relevant Imaging Results:  Relevant Lab Results:   Additional Information SS#: [id number]  Karn CassisStultz, Yeilyn Gent [place], [place] [phone]

## 2015-04-15 NOTE — Progress Notes (Signed)
Patient requested Dulcolax to continue bowel movements atfer Milk & Molasses enema yesterday.  Dulcolax given.

## 2015-04-15 NOTE — Progress Notes (Signed)
[facility] PROGRESS NOTE  Margaret Stokes UJW:[id number] DOB: 05/29/1950 DOA: 04/05/2015 PCP: Carlean Jews  Assessment/Plan: 1. COPD exacerbation, slowly improving with IV steroids and supplemental O2. Will reduce IV steroids today and likely transition to oral steroids tomorrow per pulmonology. Completed course of abx. CT chest negative. Afebrile.    2. Acute on chronic hypoxic respiratory failure, currently on 5L of oxygen via [place]. Will continue to wean as tolerated.  3. Acute on chronic diastolic CHF, ECHO from 11/15 shows normal ejection fraction with grade 1 diastolic dysfunction. Continue oral Lasix and monitor I&Os. Appears compensated at this time. 4. AKI, likely related to Lasix use. Stable. Continue to monitor in the setting of diuresis.  5. HLD, continue statin 6. Constipation, resolved with milk and molasess enema 7. History of SVT. Currently controlled on cardizem. Follow up with cardiology.  Code Status: Full DVT prophylaxis: Lovenox Family Communication: no family present Disposition Plan: SNF placement on discharge   Consultants:  Pulmonary  PT- SNF  Procedures:    Antibiotics:  Rocephin 1/1 >> 1/3  Azithromycin 1/2 >> 1/8  HPI/Subjective: Feels like her breathing is slowly improving.   Objective: Filed Vitals:   04/14/15 2009 04/15/15 0652  BP: 118/69 121/75  Pulse: 108 84  Temp: 98.2 F (36.8 C) 97.6 F (36.4 C)  Resp: 24 22    Intake/Output Summary (Last 24 hours) at 04/15/15 0809 Last data filed at 04/15/15 0654  Gross per 24 hour  Intake    723 ml  Output   1350 ml  Net   -627 ml   Filed Weights   04/13/15 0500 04/14/15 0659 04/15/15 0652  Weight: 83.5 kg (184 lb 1.4 oz) 88.4 kg (194 lb 14.2 oz) 83.462 kg (184 lb)    Exam:  General: Improved work of breathing Cardiovascular: RRR, S1, S2  Respiratory: clear breath sounds bilaterally, no wheezing  Abdomen: soft, non tender, no distention , bowel sounds  normal Musculoskeletal: No edema b/l  Data Reviewed: Basic Metabolic Panel:  Recent Labs Lab 04/11/15 0446 04/12/15 0428 04/15/15 0613  NA 141 140 142  K 3.8 3.8 4.3  CL 99* 98* 98*  CO2 33* 33* 34*  GLUCOSE 168* 171* 170*  BUN 31* 35* 33*  CREATININE 0.76 0.80 0.77  CALCIUM 8.6* 8.4* 8.4*    CBC:  Recent Labs Lab 04/15/15 0613  WBC 14.5*  HGB 14.9  HCT 45.5  MCV 95.4  PLT 201   BNP (last 3 results)  Recent Labs  04/07/15 0512  BNP 26.0     Recent Results (from the past 240 hour(s))  MRSA PCR Screening     Status: None   Collection Time: 04/05/15 10:00 AM  Result Value Ref Range Status   MRSA by PCR NEGATIVE NEGATIVE Final    Comment:        The GeneXpert MRSA Assay (FDA approved for NASAL specimens only), is one component of a comprehensive MRSA colonization surveillance program. It is not intended to diagnose MRSA infection nor to guide or monitor treatment for MRSA infections.   Culture, blood (routine x 2) Call MD if unable to obtain prior to antibiotics being given     Status: None   Collection Time: 04/05/15 10:30 AM  Result Value Ref Range Status   Specimen Description BLOOD LEFT ANTECUBITAL  Final   Special Requests BOTTLES DRAWN AEROBIC AND ANAEROBIC 10CC EACH  Final   Culture NO GROWTH 5 DAYS  Final   Report Status 04/10/2015 FINAL  Final  Culture, blood (routine x 2) Call MD if unable to obtain prior to antibiotics being given     Status: None   Collection Time: 04/05/15 10:36 AM  Result Value Ref Range Status   Specimen Description BLOOD RIGHT HAND  Final   Special Requests BOTTLES DRAWN AEROBIC AND ANAEROBIC 10CC EACH  Final   Culture NO GROWTH 5 DAYS  Final   Report Status 04/10/2015 FINAL  Final      Scheduled Meds: . aspirin EC  81 mg Oral Daily  . atorvastatin  10 mg Oral Daily  . budesonide-formoterol  2 puff Inhalation BID  . diltiazem  300 mg Oral Daily  . enoxaparin (LOVENOX) injection  40 mg Subcutaneous Q24H  .  famotidine  10 mg Oral BID  . furosemide  40 mg Oral Daily  . ipratropium-albuterol  3 mL Nebulization Q6H WA  . methylPREDNISolone (SOLU-MEDROL) injection  40 mg Intravenous Q6H  . multivitamin with minerals  1 tablet Oral Daily  . sodium chloride  3 mL Intravenous Q12H  . traZODone  100 mg Oral QHS   Continuous Infusions:   Principal Problem:   Acute on chronic respiratory failure (HCC) Active Problems:   COPD exacerbation (HCC)   CAP (community acquired pneumonia)   Acute respiratory failure (HCC)   Chronic obstructive pulmonary disease with acute exacerbation (HCC)   Acute on chronic diastolic CHF (congestive heart failure) (HCC)    Time spent: 25 minutes   Arfa Lamarca. MD  [facility] Pager [phone]. If 7PM-7AM, please contact night-coverage at [email or web address], password [email or web address] 04/15/2015, 8:09 AM  LOS: 10 days     By signing my name below, I, Burnett HarryJennifer Gregorio, attest that this documentation has been prepared under the direction and in the presence of [facility] HospitalJehanzeb Kengo Sturges. MD Electronically Signed: Burnett HarryJennifer Gregorio, Scribe. 04/15/2015 12:10pm  I, Dr. Erick BlinksJehanzeb Trevione Wert, personally performed the services described in this documentaiton. All medical record entries made by the scribe were at my direction and in my presence. I have reviewed the chart and agree that the record reflects my personal performance and is accurate and complete  Erick BlinksJehanzeb Elisabel Hanover, MD, 04/15/2015 12:22 PM

## 2015-04-15 NOTE — Clinical Social Work Note (Addendum)
Clinical Social Work Assessment  Patient Details  Name: Margaret Stokes MRN: [id number] Date of Birth: 10/14/1950  Date of referral:  04/15/15               Reason for consult:  Discharge Planning                Permission sought to share information with:    Permission granted to share information::     Name::        Agency::     Relationship::     Contact Information:     Housing/Transportation Living arrangements for the past 2 months:  Single Family Home Source of Information:  Patient Patient Interpreter Needed:  None Criminal Activity/Legal Involvement Pertinent to Current Situation/Hospitalization:  No - Comment as needed Significant Relationships:  None Lives with:  Self Do you feel safe going back to the place where you live?  Yes Need for family participation in patient care:  No (Coment)  Care giving concerns:  Pt lives alone.    Social Worker assessment / plan:  CSW met with pt at bedside. Pt alert and oriented and reports she lives alone. She has very limited local support as her family lives in [place] and her son is a Administrator. Pt indicates that at baseline she is independent and still drives. She is on disability due to COPD. Pt is currently very limited due to respiratory status. PT evaluated pt yesterday and recommend SNF. CSW discussed placement process and provided SNF list. Pt is aware of authorization process. She states that if she is able to walk she will return home, but otherwise will go to SNF. She requests [facility] as this is near her home. Her plan is to move to [place] soon to be close to family.   Employment status:  Disabled (Comment on whether or not currently receiving Disability) Insurance information:  Managed Medicare PT Recommendations:  [place] / Referral to community resources:  [place]  Patient/Family's Response to care:  Pt agreeable to consider short term SNF.    Patient/Family's Understanding of and Emotional Response to Diagnosis, Current Treatment, and Prognosis:  Pt is aware of admission diagnosis and treatment plan. She understands PT recommendations and is agreeable as long as she is unable to ambulate.   Emotional Assessment Appearance:  Appears older than stated age Attitude/Demeanor/Rapport:  Other (Cooperative) Affect (typically observed):  Appropriate Orientation:  Oriented to Self, Oriented to Place, Oriented to  Time, Oriented to Situation Alcohol / Substance use:  Not Applicable Psych involvement (Current and /or in the community):  No (Comment)  Discharge Needs  Concerns to be addressed:  Discharge Planning Concerns Readmission within the last 30 days:  No Current discharge risk:  Lives alone, Physical Impairment Barriers to Discharge:  Continued Medical Work up   [employer], [employer], [place] 04/15/2015, 9:21 AM [phone]

## 2015-04-15 NOTE — Progress Notes (Signed)
She feels better. She does not feel like she is at baseline. She is still requiring fairly high flow oxygen. She is still on IV steroids. It has been recommended that she go to skilled care facility which I think is appropriate.  She looks mildly short of breath. Her chest is clear however.  She is improving slowly. My plan would be to reduce steroids to 40 mg IV every 12 hours today with transition to oral tomorrow. She would be okay to go to the skilled care facility from a pulmonary point of view when they can manage her oxygenation requirements. I will be out of town for the next 3 days and I will sign off at this point.

## 2015-04-15 NOTE — Clinical Social Work Placement (Signed)
   CLINICAL SOCIAL WORK PLACEMENT  NOTE  Date:  04/15/2015  Patient Details  Name: Darci Currenteresa Ann Fallert MRN: [id number] Date of Birth: 10/05/1950  Clinical Social Work is seeking post-discharge placement for this patient at the Skilled  Nursing Facility level of care (*CSW will initial, date and re-position this form in  chart as items are completed):  Yes   Patient/family provided with [facility] Clinical Social Work Department's list of facilities offering this level of care within the geographic area requested by the patient (or if unable, by the patient's family).  Yes   Patient/family informed of their freedom to choose among providers that offer the needed level of care, that participate in Medicare, Medicaid or managed care program needed by the patient, have an available bed and are willing to accept the patient.  Yes   Patient/family informed of [facility]'s ownership interest [facility] and [facility], as well as of the fact that they are under no obligation to receive care at these facilities.  PASRR submitted to EDS on 04/15/15     PASRR number received on 04/15/15     Existing PASRR number confirmed on       FL2 transmitted to all facilities in geographic area requested by pt/family on 04/15/15     FL2 transmitted to all facilities within larger geographic area on       Patient informed that his/her managed care company has contracts with or will negotiate with certain facilities, including the following:            Patient/family informed of bed offers received.  Patient chooses bed at       Physician recommends and patient chooses bed at      Patient to be transferred to   on  .  Patient to be transferred to facility by       Patient family notified on   of transfer.  Name of family member notified:        PHYSICIAN       Additional Comment:    _______________________________________________ Karn CassisStultz, Deanglo Hissong [place], LCSW 04/15/2015, 9:18  AM [phone]

## 2015-04-16 MED ORDER — BENZONATATE 200 MG PO CAPS: 200.0000 mg | ORAL_CAPSULE | Freq: Three times a day (TID) | ORAL | Status: AC | PRN

## 2015-04-16 MED ORDER — FLUCONAZOLE 100 MG PO TABS
100.0000 mg | ORAL_TABLET | Freq: Every day | ORAL | Status: DC
  Administered 2015-04-16: 100 mg via ORAL
  Filled 2015-04-16 (×2): qty 1

## 2015-04-16 MED ORDER — FLUCONAZOLE 100 MG PO TABS: 100.0000 mg | ORAL_TABLET | Freq: Every day | ORAL | Status: AC

## 2015-04-16 MED ORDER — MAGIC MOUTHWASH W/LIDOCAINE: 15.0000 mL | Freq: Three times a day (TID) | ORAL | Status: DC

## 2015-04-16 MED ORDER — BISACODYL 5 MG PO TBEC: 10.0000 mg | DELAYED_RELEASE_TABLET | Freq: Every day | ORAL | Status: AC | PRN

## 2015-04-16 MED ORDER — MAGIC MOUTHWASH W/LIDOCAINE: 15.0000 mL | Freq: Three times a day (TID) | ORAL | Status: AC

## 2015-04-16 MED ORDER — FUROSEMIDE 40 MG PO TABS: ORAL_TABLET | ORAL | Status: AC

## 2015-04-16 MED ORDER — HYDROCODONE-HOMATROPINE 5-1.5 MG/5ML PO SYRP: 5.0000 mL | ORAL_SOLUTION | ORAL | Status: AC | PRN

## 2015-04-16 MED ORDER — PREDNISONE 20 MG PO TABS: ORAL_TABLET | ORAL | Status: AC

## 2015-04-16 MED ORDER — LIDOCAINE VISCOUS 2 % MT SOLN
5.0000 mL | Freq: Three times a day (TID) | OROMUCOSAL | Status: DC
  Administered 2015-04-16: 5 mL via OROMUCOSAL
  Filled 2015-04-16: qty 15

## 2015-04-16 MED ORDER — BUDESONIDE-FORMOTEROL FUMARATE 160-4.5 MCG/ACT IN AERO: 2.0000 | INHALATION_SPRAY | Freq: Two times a day (BID) | RESPIRATORY_TRACT | Status: AC

## 2015-04-16 MED ORDER — MAGIC MOUTHWASH
10.0000 mL | Freq: Three times a day (TID) | ORAL | Status: DC
  Administered 2015-04-16: 10 mL via ORAL
  Filled 2015-04-16: qty 10

## 2015-04-16 MED ORDER — GUAIFENESIN ER 600 MG PO TB12: 600.0000 mg | ORAL_TABLET | Freq: Two times a day (BID) | ORAL | Status: AC

## 2015-04-16 NOTE — Progress Notes (Signed)
Sitting up at bedside, oxygen at 4L, foley removed for trial voiding per concensus in progressin. Will monitor urine output

## 2015-04-16 NOTE — Care Management Important Message (Signed)
Important Message  Patient Details  Name: Margaret Stokes MRN: [id number] Date of Birth: 11/26/1950   Medicare Important Message Given:  Yes    Adonis HugueninBerkhead, Mikinzie Maciejewski L, RN 04/16/2015, 10:01 AM

## 2015-04-16 NOTE — Discharge Summary (Signed)
Physician Discharge Summary  Margaret Stokes ZOX:[id number] DOB: 11/16/1950 DOA: 04/05/2015  PCP: Carlean JewsSLATE,SCOTT, PA-C  Admit date: 04/05/2015 Discharge date: 04/16/2015  Time spent: 35 minutes  Recommendations for Outpatient Follow-up:  1. Discharge to SNF. 2. Follow up with PCP in 1-2 weeks. 3. Follow up with your cardiologist as needed.  Discharge Diagnoses:  Principal Problem:   Acute on chronic respiratory failure (HCC) Active Problems:   COPD exacerbation (HCC)   CAP (community acquired pneumonia)   Acute respiratory failure (HCC)   Chronic obstructive pulmonary disease with acute exacerbation (HCC)   Acute on chronic diastolic CHF (congestive heart failure) (HCC)   Discharge Condition: Improved  Diet recommendation: Heart Healthy  Filed Weights   04/13/15 0500 04/14/15 0659 04/15/15 0652  Weight: 83.5 kg (184 lb 1.4 oz) 88.4 kg (194 lb 14.2 oz) 83.462 kg (184 lb)    History of present illness:  65 y/o female with a hx of COPD, CHF, and SVT presented with significant SOB, requiring BIPAP in the ED. It was felt that her acute on chronic respiratory failure was related to a COPD exacerabation. She was admitted for further management.   Hospital Course:  Patient presented with a COPD exacerbation. Pulmonology input appreciated. She initially required BIPAP therapy but was subsequently weaned and placed on [place]. She was started on moderate dose of IV steroids but has since been weaned down and transitioned to oral medications. She has completed a course of abx during admission and was continued on bronchodilators with significant improvement in her SOB. CT chest was negative for PE. She will be discharged on a Prednisone taper and encouraged to continue her bronchodilators and pulmonary hygiene. Further progress will likely be slow.   Acute on chronic hypoxic respiratory failure, currently on 4L of oxygen via [place]. She will require home O2 upon discharge which can be weaned at SNF as  tolerated.  1. Acute on chronic diastolic CHF, ECHO from 11/15 shows normal ejection fraction with grade 1 diastolic dysfunction. Patient was diuresis with IV Lasix and has since been transitioned to oral Lasix. Appears compensated at this time. 2. HLD, continue statin 3. Constipation, resolved with milk and molasess enema 4. History of SVT. Currently controlled on cardizem. Follow up with cardiology. 5. Oral thrush, likely related to steroid use. Patient started on Magic mouthwash and Fluconazole.   Procedures:  none  Consultants:  Pulmonary  PT- SNF  Discharge Exam: Filed Vitals:   04/16/15 0602 04/16/15 1000  BP: 120/74 123/67  Pulse: 86   Temp: 97.9 F (36.6 C)   Resp: 22      General: NAD, looks comfortable  Cardiovascular: RRR, S1, S2   Respiratory: clear bilaterally, No wheezing, rales or rhonchi  Abdomen: soft, non tender, no distention , bowel sounds normal  Musculoskeletal: No edema b/l  Discharge Instructions  Discharge Instructions    Diet - low sodium heart healthy    Complete by:  As directed      Increase activity slowly    Complete by:  As directed           Current Discharge Medication List    START taking these medications   Details  benzonatate (TESSALON) 200 MG capsule Take 1 capsule (200 mg total) by mouth 3 (three) times daily as needed for cough. Qty: 20 capsule, Refills: 0    bisacodyl (DULCOLAX) 5 MG EC tablet Take 2 tablets (10 mg total) by mouth daily as needed for moderate constipation. Qty: 30 tablet, Refills: 0  budesonide-formoterol (SYMBICORT) 160-4.5 MCG/ACT inhaler Inhale 2 puffs into the lungs 2 (two) times daily. Qty: 1 Inhaler, Refills: 12    fluconazole (DIFLUCAN) 100 MG tablet Take 1 tablet (100 mg total) by mouth daily. For 7 days    guaiFENesin (MUCINEX) 600 MG 12 hr tablet Take 1 tablet (600 mg total) by mouth 2 (two) times daily.    HYDROcodone-homatropine (HYCODAN) 5-1.5 MG/5ML syrup Take 5 mLs by mouth  every 4 (four) hours as needed for cough. Qty: 120 mL, Refills: 0    magic mouthwash w/lidocaine SOLN Take 15 mLs by mouth 3 (three) times daily. Refills: 0    predniSONE (DELTASONE) 20 MG tablet Take 60mg  po daily for 2 days then 40mg  daily for 3days then 30mg  daily for 3 days then 20mg  daily for 3 days then 10mg  daily for 3 days then stop      CONTINUE these medications which have CHANGED   Details  furosemide (LASIX) 40 MG tablet Take 1 tab daily      CONTINUE these medications which have NOT CHANGED   Details  albuterol (PROAIR HFA) 108 (90 BASE) MCG/ACT inhaler Inhale 2 puffs into the lungs every 6 (six) hours as needed for wheezing or shortness of breath.    aspirin EC 81 MG tablet Take 81 mg by mouth daily.    atorvastatin (LIPITOR) 10 MG tablet Take 10 mg by mouth daily.    calcium carbonate (TUMS - DOSED IN MG ELEMENTAL CALCIUM) 500 MG chewable tablet Chew 1 tablet by mouth as needed.    diltiazem (CARDIZEM CD) 300 MG 24 hr capsule Take 1 capsule (300 mg total) by mouth daily. Qty: 30 capsule, Refills: 11    famotidine (PEPCID) 10 MG tablet Take 10 mg by mouth 2 (two) times daily.    ipratropium-albuterol (DUONEB) 0.5-2.5 (3) MG/3ML SOLN Take 3 mLs by nebulization 4 (four) times daily.     Multiple Vitamin (MULTIVITAMIN) tablet Take 1 tablet by mouth daily.    traZODone (DESYREL) 100 MG tablet Take 100 mg by mouth at bedtime.       No Known Allergies   The results of significant diagnostics from this hospitalization (including imaging, microbiology, ancillary and laboratory) are listed below for reference.    Significant Diagnostic Studies: Ct Angio Chest Pe W/cm &/or Wo Cm  04/11/2015  CLINICAL DATA:  Increased shortness of breath. Difficulty breathing. EXAM: CT ANGIOGRAPHY CHEST WITH CONTRAST TECHNIQUE: Multidetector CT imaging of the chest was performed using the standard protocol during bolus administration of intravenous contrast. Multiplanar CT image  reconstructions and MIPs were obtained to evaluate the vascular anatomy. CONTRAST:  OMNIPAQUE IOHEXOL 350 MG/ML SOLN COMPARISON:  01/01/2009 FINDINGS: Mediastinum: The heart size appears normal. There is no pericardial effusion identified. The trachea appears patent and is midline. Moderate hiatal hernia. No mediastinal or hilar adenopathy. No supraclavicular or axillary adenopathy. No abnormal filling defects identified within the main pulmonary artery or its branches to suggest a clinically significant acute pulmonary embolus Lungs/Pleura: No pleural fluid identified. Moderate changes of centrilobular and paraseptal emphysema. No airspace consolidation, atelectasis or interstitial edema. Upper Abdomen: No focal liver abnormality identified. The visualized portions of the spleen and pancreas are normal. The adrenal glands are unremarkable. Musculoskeletal: No aggressive lytic or sclerotic bone lesions identified. Review of the MIP images confirms the above findings. IMPRESSION: 1. No acute pulmonary embolus. 2. Emphysema. 3. Hiatal hernia Electronically Signed   By: Signa Kell M.D.   On: 04/11/2015 11:44   Dg  Chest Port 1 View  04/09/2015  CLINICAL DATA:  Respiratory failure. EXAM: PORTABLE CHEST 1 VIEW COMPARISON:  04/07/2015 . FINDINGS: Mediastinum and hilar structures normal. Lungs are clear. Stable mild cardiomegaly. No pulmonary venous congestion . No pleural effusion or pneumothorax. IMPRESSION: 1. Stable mild cardiomegaly.  No pulmonary venous congestion. 2. No acute pulmonary disease . Electronically Signed   By: Maisie Fus  Register   On: 04/09/2015 09:13   Dg Chest Port 1 View  04/07/2015  CLINICAL DATA:  Hypoxemia EXAM: PORTABLE CHEST 1 VIEW COMPARISON:  04/05/2015 and 06/27/2011 FINDINGS: Cardiomediastinal silhouette is stable. Hyperinflation again noted. Streaky mild basilar atelectasis or infiltrate with improvement in aeration. No pulmonary edema. IMPRESSION: Hyperinflation. Improvement in  aeration. No pulmonary edema. Residual mild streaky basilar atelectasis or infiltrate Electronically Signed   By: Natasha Mead M.D.   On: 04/07/2015 08:03   Dg Chest Portable 1 View  04/05/2015  CLINICAL DATA:  Acute onset of respiratory distress. Generalized weakness and chest pressure. Initial encounter. EXAM: PORTABLE CHEST 1 VIEW COMPARISON:  None. FINDINGS: The lungs are hyperexpanded, with flattening of the hemidiaphragms, raising concern for COPD. Mild bibasilar opacities may reflect mild interstitial edema or pneumonia. Mild vascular congestion is noted. There is no evidence of pleural effusion or pneumothorax. The cardiomediastinal silhouette is borderline normal in size. No acute osseous abnormalities are seen. IMPRESSION: 1. Mild bibasilar airspace opacities may reflect mild interstitial edema or pneumonia. Mild vascular congestion noted. 2. Findings of COPD. Electronically Signed   By: Roanna Raider M.D.   On: 04/05/2015 05:55   Dg Chest Port 1v Same Day  04/09/2015  CLINICAL DATA:  Increased shortness of breath. History of SVT, COPD. EXAM: PORTABLE CHEST 1 VIEW COMPARISON:  Chest x-rays dated 04/09/2015, 04/07/2015 and 05/30/2011. FINDINGS: Heart size is normal. Overall cardiomediastinal silhouette is stable in size and configuration. 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 No confluent airspace opacity to suggest a developing pneumonia. No pleural effusion seen. Osseous structures about the chest are unremarkable. IMPRESSION: 1. COPD/emphysema. 2. No evidence of acute cardiopulmonary abnormality. Electronically Signed   By: Bary Richard M.D.   On: 04/09/2015 18:58     Labs: Basic Metabolic Panel:  Recent Labs Lab 04/11/15 0446 04/12/15 0428 04/15/15 0613  NA 141 140 142  K 3.8 3.8 4.3  CL 99* 98* 98*  CO2 33* 33* 34*  GLUCOSE 168* 171* 170*  BUN 31* 35* 33*  CREATININE 0.76 0.80 0.77  CALCIUM 8.6* 8.4* 8.4*    CBC:  Recent Labs Lab 04/15/15 0613   WBC 14.5*  HGB 14.9  HCT 45.5  MCV 95.4  PLT 201    BNP: BNP (last 3 results)  Recent Labs  04/07/15 0512  BNP 26.0     Signed: Aysha Livecchi. MD [facility] 04/16/2015, 12:35 PM    By signing my name below, I, Burnett Harry, attest that this documentation has been prepared under the direction and in the presence of [facility]. MD Electronically Signed: Burnett Harry, Scribe. 04/16/2015   I, Dr. Erick Blinks, personally performed the services described in this documentaiton. All medical record entries made by the scribe were at my direction and in my presence. I have reviewed the chart and agree that the record reflects my personal performance and is accurate and complete  Erick Blinks, MD, 04/16/2015 12:35 PM

## 2015-04-16 NOTE — Clinical Social Work Note (Signed)
CSW spoke with [name] at [facility] who advised that patient had been [facility] She stated that the authorization number was [id number].   Annice NeedySettle, Nelwyn Hebdon D, [place] [phone]

## 2015-04-16 NOTE — Clinical Social Work Placement (Signed)
   CLINICAL SOCIAL WORK PLACEMENT  NOTE  Date:  04/16/2015  Patient Details  Name: Margaret Stokes MRN: [id number] Date of Birth: 03/24/1951  Clinical Social Work is seeking post-discharge placement for this patient at the Skilled  Nursing Facility level of care (*CSW will initial, date and re-position this form in  chart as items are completed):  Yes   Patient/family provided with [place] Clinical Social Work Department's list of facilities offering this level of care within the geographic area requested by the patient (or if unable, by the patient's family).  Yes   Patient/family informed of their freedom to choose among providers that offer the needed level of care, that participate in Medicare, Medicaid or managed care program needed by the patient, have an available bed and are willing to accept the patient.  Yes   Patient/family informed of 's ownership interest in [facility] and [facility], as well as of the fact that they are under no obligation to receive care at these facilities.  PASRR submitted to EDS on 04/15/15     PASRR number received on 04/15/15     Existing PASRR number confirmed on       FL2 transmitted to all facilities in geographic area requested by pt/family on 04/15/15     FL2 transmitted to all facilities within larger geographic area on       Patient informed that his/her managed care company has contracts with or will negotiate with certain facilities, including the following:        Yes   Patient/family informed of bed offers received.  [facility]     Physician recommends and patient chooses bed at      [facility] on 04/16/15.  [facility]     Patient family notified on 04/16/15 of transfer.  Name of family member notified:  pt states she will notify family      PHYSICIAN       Additional Comment:    _______________________________________________ Karn CassisStultz, Magdalena Skilton [place], LCSW 04/16/2015, 2:08 PM [phone]

## 2015-04-16 NOTE — Progress Notes (Signed)
EMS to room [facility], assisted to stretcher and oxygen to portable tank at [id number], packet given to EMS.  [facility], talked to [place], Engineer, civil (consulting)urse, questions answered.  Patient stable and smiling at discharge.

## 2015-06-23 ENCOUNTER — Ambulatory Visit: Payer: Medicare HMO | Admitting: Cardiology

## 2015-08-25 ENCOUNTER — Other Ambulatory Visit (HOSPITAL_COMMUNITY): Payer: Self-pay | Admitting: Pulmonary Disease

## 2015-08-25 ENCOUNTER — Ambulatory Visit (HOSPITAL_COMMUNITY): Payer: Medicare HMO

## 2015-08-25 DIAGNOSIS — M25551 Pain in right hip: Secondary | ICD-10-CM

## 2016-07-30 IMAGING — CT CT ANGIO CHEST
2 of 6 series · 6 of 36 positions shown · IV contrast (Omnipaque 300)
Comparison: 01/01/2009

CLINICAL DATA: Increased shortness of breath. Difficulty breathing.

EXAM:
CT ANGIOGRAPHY CHEST WITH CONTRAST
TECHNIQUE: Multidetector CT imaging of the chest was performed using the
standard protocol during bolus administration of intravenous
contrast. Multiplanar CT image reconstructions and MIPs were
obtained to evaluate the vascular anatomy.
CONTRAST:  100mL OMNIPAQUE IOHEXOL 350 MG/ML SOLN

[Series 6: pe 3.0 b40f · axial · 0.67mm/px · z∈[-256,-52]mm · 5 of 103 slices shown]
[im 18/103  lung]
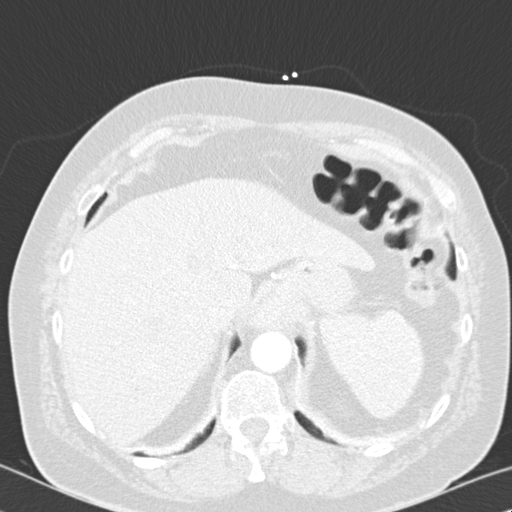
[im 35/103  mediastinal]
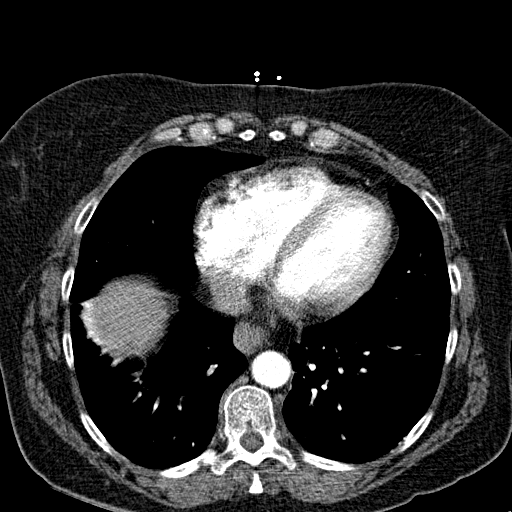
[im 52/103  lung]
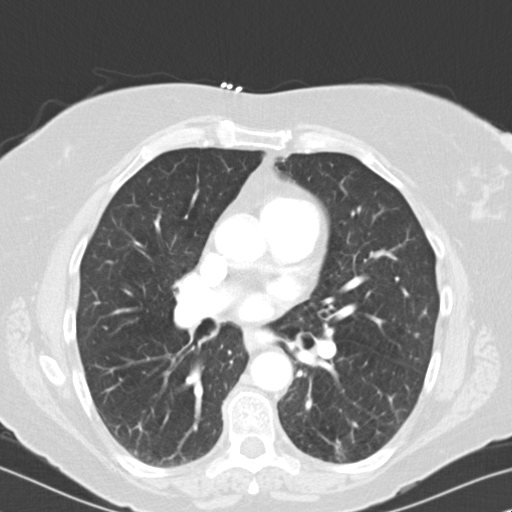
[im 69/103  mediastinal]
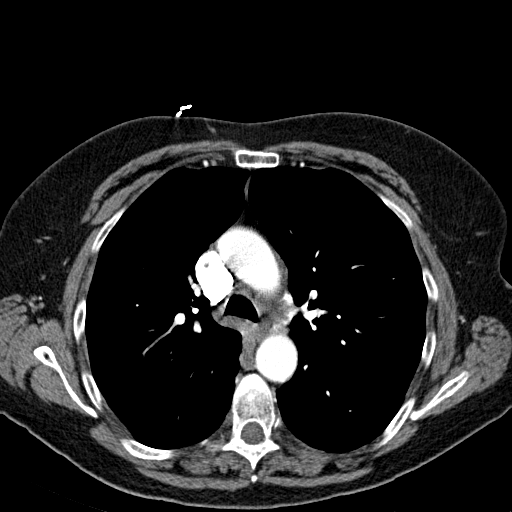
[im 86/103  lung]
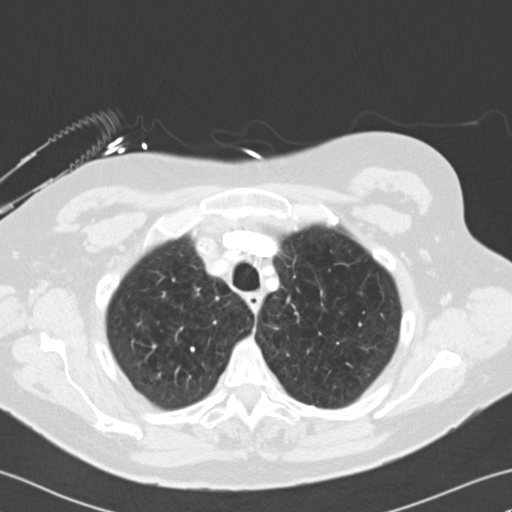

[Series 8: mpr coronal pe 3mm · coronal · 0.62mm/px · 1 of 91 slices shown]
[im 46/91  mediastinal]
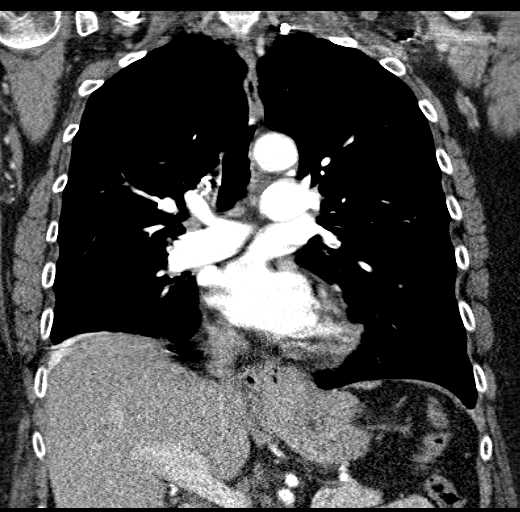

[6 of 36 positions shown; findings below may reference images not displayed]

FINDINGS: Mediastinum: The heart size appears normal. There is no pericardial
effusion identified. The trachea appears patent and is midline.
Moderate hiatal hernia. No mediastinal or hilar adenopathy. No
supraclavicular or axillary adenopathy.

No abnormal filling defects identified within the main pulmonary
artery or its branches to suggest a clinically significant acute
pulmonary embolus

Lungs/Pleura: No pleural fluid identified. Moderate changes of
centrilobular and paraseptal emphysema. No airspace consolidation,
atelectasis or interstitial edema.

Upper Abdomen: No focal liver abnormality identified. The visualized
portions of the spleen and pancreas are normal. The adrenal glands
are unremarkable.

Musculoskeletal: No aggressive lytic or sclerotic bone lesions
identified.

Review of the MIP images confirms the above findings.
IMPRESSION: 1. No acute pulmonary embolus.
2. Emphysema.
3. Hiatal hernia

## 2017-12-04 ENCOUNTER — Telehealth: Payer: Self-pay | Admitting: Cardiology

## 2017-12-04 NOTE — Telephone Encounter (Signed)
Numerous attempts to contact patient with recall letters. Unable to reach by telephone. with no success.   06/18/2015 1:24 PM New [10]    [System] 12/03/2015 11:04 PM Notification Sent [20]   Megan Salon [[id number]] 05/30/2016 3:17 PM Notification Sent [20]   Megan Salon [[id number]] 06/30/2016 4:23 PM Notification Sent [20]   Megan Salon [[id number]] 08/16/2016 7:47 AM Notification Sent [20]   Geraldine Contras [[id number]] 09/14/2016 10:36 AM Notification Sent [20]

## 2020-09-01 DEATH — deceased
